# Patient Record
Sex: Female | Born: 1945 | Race: Black or African American | Hispanic: No | Marital: Single | Smoking: Never smoker
Health system: Southern US, Community
[De-identification: ages and names within clinical notes are randomized; demographics above are authoritative.]

## PROBLEM LIST (undated history)

## (undated) DIAGNOSIS — I10 Essential (primary) hypertension: Secondary | ICD-10-CM

## (undated) DIAGNOSIS — I509 Heart failure, unspecified: Secondary | ICD-10-CM

## (undated) DIAGNOSIS — E119 Type 2 diabetes mellitus without complications: Secondary | ICD-10-CM

## (undated) HISTORY — PX: ABDOMINAL HYSTERECTOMY: SHX81

## (undated) HISTORY — PX: CHOLECYSTECTOMY: SHX55

## (undated) HISTORY — PX: ANKLE SURGERY: SHX546

---

## 2015-11-28 ENCOUNTER — Emergency Department (HOSPITAL_COMMUNITY): Payer: Medicare HMO

## 2015-11-28 ENCOUNTER — Encounter (HOSPITAL_COMMUNITY): Payer: Self-pay

## 2015-11-28 ENCOUNTER — Emergency Department (HOSPITAL_COMMUNITY)
Admission: EM | Admit: 2015-11-28 | Discharge: 2015-11-29 | Disposition: A | Discharge status: Home / Self Care | Payer: Medicare HMO | Attending: Emergency Medicine | Admitting: Emergency Medicine

## 2015-11-28 DIAGNOSIS — M7989 Other specified soft tissue disorders: Secondary | ICD-10-CM

## 2015-11-28 DIAGNOSIS — S8012XA Contusion of left lower leg, initial encounter: Secondary | ICD-10-CM | POA: Diagnosis not present

## 2015-11-28 DIAGNOSIS — E119 Type 2 diabetes mellitus without complications: Secondary | ICD-10-CM | POA: Insufficient documentation

## 2015-11-28 DIAGNOSIS — Y998 Other external cause status: Secondary | ICD-10-CM | POA: Diagnosis not present

## 2015-11-28 DIAGNOSIS — Z79899 Other long term (current) drug therapy: Secondary | ICD-10-CM | POA: Insufficient documentation

## 2015-11-28 DIAGNOSIS — I10 Essential (primary) hypertension: Secondary | ICD-10-CM | POA: Diagnosis not present

## 2015-11-28 DIAGNOSIS — M79662 Pain in left lower leg: Secondary | ICD-10-CM

## 2015-11-28 DIAGNOSIS — Z791 Long term (current) use of non-steroidal anti-inflammatories (NSAID): Secondary | ICD-10-CM | POA: Diagnosis not present

## 2015-11-28 DIAGNOSIS — Y9389 Activity, other specified: Secondary | ICD-10-CM | POA: Diagnosis not present

## 2015-11-28 DIAGNOSIS — S8992XA Unspecified injury of left lower leg, initial encounter: Secondary | ICD-10-CM | POA: Diagnosis present

## 2015-11-28 DIAGNOSIS — Y9289 Other specified places as the place of occurrence of the external cause: Secondary | ICD-10-CM | POA: Insufficient documentation

## 2015-11-28 DIAGNOSIS — W07XXXA Fall from chair, initial encounter: Secondary | ICD-10-CM | POA: Insufficient documentation

## 2015-11-28 DIAGNOSIS — Z794 Long term (current) use of insulin: Secondary | ICD-10-CM | POA: Diagnosis not present

## 2015-11-28 HISTORY — DX: Essential (primary) hypertension: I10

## 2015-11-28 HISTORY — DX: Type 2 diabetes mellitus without complications: E11.9

## 2015-11-28 LAB — HEPATIC FUNCTION PANEL
ALBUMIN: 3 g/dL — AB (ref 3.5–5.0)
ALT: 16 U/L (ref 14–54)
AST: 15 U/L (ref 15–41)
Alkaline Phosphatase: 145 U/L — ABNORMAL HIGH (ref 38–126)
BILIRUBIN TOTAL: 0.5 mg/dL (ref 0.3–1.2)
Bilirubin, Direct: <0.1 mg/dL — ABNORMAL LOW (ref 0.1–0.5)
TOTAL PROTEIN: 7.3 g/dL (ref 6.5–8.1)

## 2015-11-28 LAB — CBC
HCT: 30.6 % — ABNORMAL LOW (ref 36.0–46.0)
Hemoglobin: 10.2 g/dL — ABNORMAL LOW (ref 12.0–15.0)
MCH: 26.4 pg (ref 26.0–34.0)
MCHC: 33.3 g/dL (ref 30.0–36.0)
MCV: 79.3 fL (ref 78.0–100.0)
PLATELETS: 241 10*3/uL (ref 150–400)
RBC: 3.86 MIL/uL — ABNORMAL LOW (ref 3.87–5.11)
RDW: 13.5 % (ref 11.5–15.5)
WBC: 6.1 10*3/uL (ref 4.0–10.5)

## 2015-11-28 LAB — BASIC METABOLIC PANEL
Anion gap: 9 (ref 5–15)
BUN: 8 mg/dL (ref 6–20)
CALCIUM: 8.9 mg/dL (ref 8.9–10.3)
CO2: 26 mmol/L (ref 22–32)
CREATININE: 0.87 mg/dL (ref 0.44–1.00)
Chloride: 101 mmol/L (ref 101–111)
Glucose, Bld: 513 mg/dL — ABNORMAL HIGH (ref 65–99)
Potassium: 4.1 mmol/L (ref 3.5–5.1)
SODIUM: 136 mmol/L (ref 135–145)

## 2015-11-28 LAB — BRAIN NATRIURETIC PEPTIDE: B NATRIURETIC PEPTIDE 5: 170.7 pg/mL — AB (ref 0.0–100.0)

## 2015-11-28 LAB — D-DIMER, QUANTITATIVE (NOT AT ARMC): D DIMER QUANT: 0.36 ug{FEU}/mL (ref 0.00–0.50)

## 2015-11-28 LAB — CBG MONITORING, ED: GLUCOSE-CAPILLARY: 124 mg/dL — AB (ref 65–99)

## 2015-11-28 MED ORDER — DOXYCYCLINE HYCLATE 100 MG PO TABS
100.0000 mg | ORAL_TABLET | Freq: Once | ORAL | Status: AC
  Administered 2015-11-28: 100 mg via ORAL
  Filled 2015-11-28: qty 1

## 2015-11-28 MED ORDER — SODIUM CHLORIDE 0.9 % IV BOLUS (SEPSIS)
1000.0000 mL | Freq: Once | INTRAVENOUS | Status: AC
  Administered 2015-11-28: 1000 mL via INTRAVENOUS

## 2015-11-28 MED ORDER — INSULIN GLARGINE 100 UNIT/ML ~~LOC~~ SOLN
50.0000 [IU] | Freq: Once | SUBCUTANEOUS | Status: AC
  Administered 2015-11-28: 50 [IU] via SUBCUTANEOUS
  Filled 2015-11-28: qty 0.5

## 2015-11-28 MED ORDER — ENOXAPARIN SODIUM 100 MG/ML ~~LOC~~ SOLN
95.0000 mg | Freq: Once | SUBCUTANEOUS | Status: AC
  Administered 2015-11-28: 95 mg via SUBCUTANEOUS
  Filled 2015-11-28: qty 1

## 2015-11-28 MED ORDER — OXYCODONE HCL 5 MG PO TABS
2.5000 mg | ORAL_TABLET | Freq: Once | ORAL | Status: AC
  Administered 2015-11-28: 2.5 mg via ORAL
  Filled 2015-11-28: qty 1

## 2015-11-28 MED ORDER — INSULIN NPH (HUMAN) (ISOPHANE) 100 UNIT/ML ~~LOC~~ SUSP
10.0000 [IU] | Freq: Once | SUBCUTANEOUS | Status: AC
  Administered 2015-11-28: 10 [IU] via SUBCUTANEOUS
  Filled 2015-11-28: qty 10

## 2015-11-28 MED ORDER — DOXYCYCLINE HYCLATE 100 MG PO CAPS: 100.0000 mg | ORAL_CAPSULE | Freq: Two times a day (BID) | ORAL | Status: DC

## 2015-11-28 MED ORDER — ONDANSETRON HCL 4 MG/2ML IJ SOLN
4.0000 mg | Freq: Once | INTRAMUSCULAR | Status: AC
  Administered 2015-11-28: 4 mg via INTRAVENOUS
  Filled 2015-11-28: qty 2

## 2015-11-28 NOTE — Discharge Instructions (Signed)
IMPORTANT PATIENT INSTRUCTIONS:   You have been scheduled for an Outpatient Vascular Study at [facility].    If tomorrow is a Saturday or Sunday, please go to the [facility] Emergency Department registration desk at 8 AM tomorrow morning and tell them you are therefore a vascular study.  If tomorrow is a weekday (Monday - Friday), please go to the [facility] Admitting Department at 8 AM and tell them you are there for a vascular study     Edema Edema is an abnormal buildup of fluids in your bodytissues. Edema is somewhatdependent on gravity to pull the fluid to the lowest place in your body. That makes the condition more common in the legs and thighs (lower extremities). Painless swelling of the feet and ankles is common and becomes more likely as you get older. It is also common in looser tissues, like around your eyes.  When the affected area is squeezed, the fluid may move out of that spot and leave a dent for a few moments. This dent is called pitting.  CAUSES  There are many possible causes of edema. Eating too much salt and being on your feet or sitting for a long time can cause edema in your legs and ankles. Hot weather may make edema worse. Common medical causes of edema include:  Heart failure.  Liver disease.  Kidney disease.  Weak blood vessels in your legs.  Cancer.  An injury.  Pregnancy.  Some medications.  Obesity. SYMPTOMS  Edema is usually painless.Your skin may look swollen or shiny.  DIAGNOSIS  Your health care provider may be able to diagnose edema by asking about your medical history and doing a physical exam. You may need to have tests such as X-rays, an electrocardiogram, or blood tests to check for medical conditions that may cause edema.  TREATMENT  Edema treatment depends on the cause. If you have heart, liver, or kidney disease, you need the treatment appropriate for these conditions. General treatment may include:  Elevation of the  affected body part above the level of your heart.  Compression of the affected body part. Pressure from elastic bandages or support stockings squeezes the tissues and forces fluid back into the blood vessels. This keeps fluid from entering the tissues.  Restriction of fluid and salt intake.  Use of a water pill (diuretic). These medications are appropriate only for some types of edema. They pull fluid out of your body and make you urinate more often. This gets rid of fluid and reduces swelling, but diuretics can have side effects. Only use diuretics as directed by your health care provider. HOME CARE INSTRUCTIONS   Keep the affected body part above the level of your heart when you are lying down.   Do not sit still or stand for prolonged periods.   Do not put anything directly under your knees when lying down.  Do not wear constricting clothing or garters on your upper legs.   Exercise your legs to work the fluid back into your blood vessels. This may help the swelling go down.   Wear elastic bandages or support stockings to reduce ankle swelling as directed by your health care provider.   Eat a low-salt diet to reduce fluid if your health care provider recommends it.   Only take medicines as directed by your health care provider. SEEK MEDICAL CARE IF:   Your edema is not responding to treatment.  You have heart, liver, or kidney disease and notice symptoms of  edema.  You have edema in your legs that does not improve after elevating them.   You have sudden and unexplained weight gain. SEEK IMMEDIATE MEDICAL CARE IF:   You develop shortness of breath or chest pain.   You cannot breathe when you lie down.  You develop pain, redness, or warmth in the swollen areas.   You have heart, liver, or kidney disease and suddenly get edema.  You have a fever and your symptoms suddenly get worse. MAKE SURE YOU:   Understand these instructions.  Will watch your  condition.  Will get help right away if you are not doing well or get worse.   This information is not intended to replace advice given to you by your health care provider. Make sure you discuss any questions you have with your health care provider.   Document Released: 08/17/2005 Document Revised: 09/07/2014 Document Reviewed: 06/09/2013 Elsevier Interactive Patient Education [employer]

## 2015-11-28 NOTE — ED Notes (Signed)
Pt to Xray.

## 2015-11-28 NOTE — ED Notes (Signed)
MD at bedside. 

## 2015-11-28 NOTE — ED Provider Notes (Signed)
CSN: [id number]     Arrival date & time 11/28/15  1748 History   First MD Initiated Contact with Patient 11/28/15 1928     Chief Complaint  Patient presents with  . leg swelling      (Consider location/radiation/quality/duration/timing/severity/associated sxs/prior Treatment) HPI Comments: 70yo F w/ h/o IDDM and HTN who p/w left leg swelling and pain. 10 days ago, the patient was at home trying to get up out of a chair and she tripped over a cord, falling forward onto her knees. Since then, she has had pain in her left lower leg and swelling associated with a lot of bruising. She had her daughter report that the swelling and bruising seemed to be improved today versus at the time of the initial injury. However, the patient was seen at an outside hospital today and had negative x-rays but was sent here to rule out DVT. The patient denies any foot numbness. No fevers or recent illness. No chest pain or shortness of breath.  The history is provided by the patient.    Past Medical History  Diagnosis Date  . Diabetes mellitus without complication (HCC)   . Hypertension    History reviewed. No pertinent past surgical history. No family history on file. Social History  Substance Use Topics  . Smoking status: Never Smoker   . Smokeless tobacco: None  . Alcohol Use: None   OB History    No data available     Review of Systems 10 Systems reviewed and are negative for acute change except as noted in the HPI.    Allergies  Codeine  Home Medications   Prior to Admission medications   Medication Sig Start Date End Date Taking? Authorizing Provider  benazepril (LOTENSIN) 20 MG tablet Take 20 mg by mouth daily.   Yes Historical Provider, MD  gabapentin (NEURONTIN) 300 MG capsule Take 300 mg by mouth 3 (three) times daily.   Yes Historical Provider, MD  insulin glargine (LANTUS) 100 UNIT/ML injection Inject 50 Units into the skin 2 (two) times daily.   Yes Historical Provider, MD   insulin NPH Human (HUMULIN N,NOVOLIN N) 100 UNIT/ML injection Inject 10 Units into the skin. Use as needed for high blood sugar readings (bridge for lantus)   Yes Historical Provider, MD  levothyroxine (SYNTHROID, LEVOTHROID) 150 MCG tablet Take 150 mcg by mouth daily before breakfast.   Yes Historical Provider, MD  naproxen (NAPROSYN) 500 MG tablet Take 500 mg by mouth 2 (two) times daily with a meal.   Yes Historical Provider, MD  doxycycline (VIBRAMYCIN) 100 MG capsule Take 1 capsule (100 mg total) by mouth 2 (two) times daily. 11/28/15   Ambrose Finland Little, MD   BP 157/79 mmHg  Pulse 83  Temp(Src) 98.7 F (37.1 C) (Oral)  Resp 18  Wt 219 lb 1.6 oz (99.383 kg)  SpO2 100% Physical Exam  Constitutional: She is oriented to person, place, and time. She appears well-developed and well-nourished. No distress.  HENT:  Head: Normocephalic and atraumatic.  Eyes: Conjunctivae are normal.  Neck: Neck supple.  Cardiovascular: Normal rate, regular rhythm and normal heart sounds.   No murmur heard. Pulmonary/Chest: Effort normal and breath sounds normal.  Abdominal: Soft. Bowel sounds are normal. She exhibits no distension. There is no tenderness.  Musculoskeletal: She exhibits edema.  Uniform edema of left lower leg from knee to ankle w/ TTP along calf and shin; 2+ DP pulse; normal sensation L foot  Neurological: She is alert and oriented to  person, place, and time.  Fluent speech  Skin: Skin is warm and dry.  Scattered ecchymoses on left lower leg from knee to ankle, left leg slightly warm compared to right  Psychiatric: She has a normal mood and affect. Judgment normal.  Nursing note and vitals reviewed.   ED Course  Procedures (including critical care time) Labs Review Labs Reviewed  CBC - Abnormal; Notable for the following:    RBC 3.86 (*)    Hemoglobin 10.2 (*)    HCT 30.6 (*)    All other components within normal limits  BASIC METABOLIC PANEL - Abnormal; Notable for the  following:    Glucose, Bld 513 (*)    All other components within normal limits  HEPATIC FUNCTION PANEL - Abnormal; Notable for the following:    Albumin 3.0 (*)    Alkaline Phosphatase 145 (*)    Bilirubin, Direct <0.1 (*)    All other components within normal limits  BRAIN NATRIURETIC PEPTIDE - Abnormal; Notable for the following:    B Natriuretic Peptide 170.7 (*)    All other components within normal limits  CBG MONITORING, ED - Abnormal; Notable for the following:    Glucose-Capillary 124 (*)    All other components within normal limits  D-DIMER, QUANTITATIVE (NOT AT [facility])    Imaging Review Dg Tibia/fibula Left  11/28/2015  CLINICAL DATA:  Fall 10 days prior. Left lower extremity swelling and pain. EXAM: LEFT TIBIA AND FIBULA - 2 VIEW COMPARISON:  None. FINDINGS: Diffuse soft tissue swelling. Surgical plate with multiple interlocking screws and single non interlocking cerclage wire is seen in the lateral distal left fibula, with no evidence of hardware fracture or loosening. Two screws are seen at the medial malleolus in the left ankle, with no evidence of hardware fracture or loosening. No osseous fracture or suspicious focal osseous lesion. No evidence of malalignment at the ankle or knee on these views. IMPRESSION: Diffuse soft tissue swelling. Left ankle hardware as described, with no hardware complication. No osseous fracture. Electronically Signed   By: Delbert Phenix M.D.   On: 11/28/2015 20:49   Dg Knee Complete 4 Views Left  11/28/2015  CLINICAL DATA:  Fall 10 days prior. Left lower extremity swelling and pain. EXAM: LEFT KNEE - COMPLETE 4+ VIEW COMPARISON:  None. FINDINGS: Diffuse soft tissue swelling. No fracture, joint effusion, dislocation or suspicious focal osseous lesion. 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 IMPRESSION: Diffuse soft tissue swelling. No fracture, joint effusion or malalignment in the left knee. Electronically Signed   By: Delbert Phenix M.D.   On: 11/28/2015 20:48   I have personally reviewed and evaluated these lab results as part of my medical decision-making.   EKG Interpretation None     Medications  sodium chloride 0.9 % bolus 1,000 mL (1,000 mLs Intravenous New Bag/Given 11/28/15 2055)  insulin NPH Human (HUMULIN N,NOVOLIN N) injection 10 Units (10 Units Subcutaneous Given 11/28/15 2055)  insulin glargine (LANTUS) injection 50 Units (50 Units Subcutaneous Given 11/28/15 2224)  oxyCODONE (Oxy IR/ROXICODONE) immediate release tablet 2.5 mg (2.5 mg Oral Given 11/28/15 2242)  ondansetron (ZOFRAN) injection 4 mg (4 mg Intravenous Given 11/28/15 2242)  enoxaparin (LOVENOX) injection 95 mg (95 mg Subcutaneous Given 11/28/15 2320)  doxycycline (VIBRA-TABS) tablet 100 mg (100 mg Oral Given 11/28/15 2319)    MDM   Final diagnoses:  Pain and swelling of left lower leg   PT w/ ongoing L lower leg pain and swelling  10 days after falling onto her legs. On exam, she was awake and alert, in no acute distress. Vital signs notable for hypertension. She was neurovascularly intact distally. Uniform swelling noted from left knee to ankle with scattered ecchymoses, mild warmth. Obtained plain films of knee and leg as well as ultrasound to evaluate for DVT. Lab work was notable for glucose 513 with normal anion gap. Gave the patient her home dose of Lantus and NPH as well as IVF.   Plain films showed soft tissue swelling without any other acute abnormalities. Due to the time of day, I was unable to obtain an ultrasound. I had a long discussion with the patient and her family members regarding risks and benefits of a dose of Lovenox and follow-up in the morning for ultrasound study here. Family voiced understanding and elected to have one dose of Lovenox here. Because of the patient's warmth and edema on exam, the differential does include cellulitis, especially in the setting of poorly controlled diabetes. I provided patient with a course  of doxycycline to cover for cellulitis. The patient's repeat blood glucose after above treatments was 124, after which I had patient eat a sandwich and drink juice. Family voiced understanding of follow-up plan and patient also instructed to see PCP in 3-4 days to ensure improvement of her symptoms. Discussed supportive care for extremity swelling including compression and elevation. Patient voiced understanding and was discharged in satisfactory condition.   Laurence Spatesachel Morgan Little, MD 11/28/15 [phone]

## 2015-11-28 NOTE — ED Notes (Signed)
CBG 124 

## 2015-11-28 NOTE — ED Notes (Signed)
Patient fell 10 days ago and continuing to have pain and swelling to LLE. Seen [facility] and had negative xrays, here to r/o DVT

## 2015-11-28 NOTE — ED Notes (Signed)
Spoke with Meagan in pharmacy X2 for status of the Lantus.

## 2015-11-28 NOTE — Progress Notes (Signed)
ANTICOAGULATION CONSULT NOTE - Initial Consult  Pharmacy Consult for Lovenox  Indication: rule out DVT  Allergies  Allergen Reactions  . Codeine Nausea Only    Vital Signs: Temp: 98.7 F (37.1 C) (03/30 1757) Temp Source: Oral (03/30 1757) BP: 165/81 mmHg (03/30 2103) Pulse Rate: 79 (03/30 2103)  Labs:  Recent Labs  11/28/15 1803  HGB 10.2*  HCT 30.6*  PLT 241  CREATININE 0.87    CrCl cannot be calculated (Unknown ideal weight.).   Medical History: Past Medical History  Diagnosis Date  . Diabetes mellitus without complication (HCC)   . Hypertension     Assessment: [age] yof admitted 11/28/2015  With leg swelling. Being worked up for possible DVT. Pharmacy consulted to dose treatment dose lovenox x 1 until the patient gets an ultrasound on 3/31. Patient not on any anticoagulation PTA. Pt weight 99.5kg.  Hgb 10.2, PLT 241, no bleeding noted  Goal of Therapy:  Monitor platelets by anticoagulation protocol: Yes   Plan:  Lovenox 95mg  x1  F/U ultrasound plans and need for further lovenox dosing F/U plans for longterm AC Monitor for bleeding  Ryane Konieczny C. Marvis MoellerMiles, PharmD Pharmacy Resident Pager: [phone] 11/28/2015 10:27 PM

## 2015-11-29 ENCOUNTER — Ambulatory Visit (HOSPITAL_BASED_OUTPATIENT_CLINIC_OR_DEPARTMENT_OTHER)
Admission: RE | Admit: 2015-11-29 | Discharge: 2015-11-29 | Disposition: A | Discharge status: Home / Self Care | Payer: Medicare HMO | Source: Ambulatory Visit | Attending: Emergency Medicine | Admitting: Emergency Medicine

## 2015-11-29 DIAGNOSIS — M79609 Pain in unspecified limb: Secondary | ICD-10-CM

## 2015-11-29 DIAGNOSIS — M7989 Other specified soft tissue disorders: Secondary | ICD-10-CM | POA: Diagnosis not present

## 2015-11-29 DIAGNOSIS — E119 Type 2 diabetes mellitus without complications: Secondary | ICD-10-CM

## 2015-11-29 DIAGNOSIS — I10 Essential (primary) hypertension: Secondary | ICD-10-CM | POA: Insufficient documentation

## 2015-11-29 DIAGNOSIS — M79662 Pain in left lower leg: Secondary | ICD-10-CM | POA: Insufficient documentation

## 2015-11-29 NOTE — Progress Notes (Signed)
VASCULAR LAB PRELIMINARY  PRELIMINARY  PRELIMINARY  PRELIMINARY  Left lower extremity venous duplex completed.    Preliminary report:  Left:  No evidence of DVT, superficial thrombosis, or Baker's cyst.  Rosi Secrist, RVT 11/29/2015, 9:14 AM

## 2017-12-11 ENCOUNTER — Encounter (HOSPITAL_COMMUNITY): Payer: Self-pay | Admitting: Emergency Medicine

## 2017-12-11 ENCOUNTER — Emergency Department (HOSPITAL_COMMUNITY)
Admission: EM | Admit: 2017-12-11 | Discharge: 2017-12-12 | Disposition: A | Discharge status: Home / Self Care | Payer: Medicare HMO | Attending: Emergency Medicine | Admitting: Emergency Medicine

## 2017-12-11 ENCOUNTER — Other Ambulatory Visit: Payer: Self-pay

## 2017-12-11 DIAGNOSIS — I110 Hypertensive heart disease with heart failure: Secondary | ICD-10-CM | POA: Insufficient documentation

## 2017-12-11 DIAGNOSIS — I509 Heart failure, unspecified: Secondary | ICD-10-CM | POA: Insufficient documentation

## 2017-12-11 DIAGNOSIS — Z794 Long term (current) use of insulin: Secondary | ICD-10-CM | POA: Diagnosis not present

## 2017-12-11 DIAGNOSIS — Z7982 Long term (current) use of aspirin: Secondary | ICD-10-CM | POA: Diagnosis not present

## 2017-12-11 DIAGNOSIS — R110 Nausea: Secondary | ICD-10-CM | POA: Insufficient documentation

## 2017-12-11 DIAGNOSIS — E11649 Type 2 diabetes mellitus with hypoglycemia without coma: Secondary | ICD-10-CM | POA: Insufficient documentation

## 2017-12-11 DIAGNOSIS — E162 Hypoglycemia, unspecified: Secondary | ICD-10-CM

## 2017-12-11 HISTORY — DX: Heart failure, unspecified: I50.9

## 2017-12-11 LAB — CBG MONITORING, ED: Glucose-Capillary: 74 mg/dL (ref 65–99)

## 2017-12-11 MED ORDER — ONDANSETRON 4 MG PO TBDP
4.0000 mg | ORAL_TABLET | Freq: Once | ORAL | Status: AC
  Administered 2017-12-11: 4 mg via ORAL
  Filled 2017-12-11: qty 1

## 2017-12-11 NOTE — ED Triage Notes (Signed)
Pt c/o nausea since trying to eat dinner tonight.

## 2017-12-11 NOTE — ED Notes (Signed)
I gave pt diet gingerale and peanut butter crackers.

## 2017-12-12 LAB — CBG MONITORING, ED: GLUCOSE-CAPILLARY: 135 mg/dL — AB (ref 65–99)

## 2017-12-12 MED ORDER — ONDANSETRON HCL 4 MG PO TABS: 4.0000 mg | ORAL_TABLET | Freq: Three times a day (TID) | ORAL | 0 refills | Status: AC | PRN

## 2017-12-12 NOTE — Discharge Instructions (Addendum)
Write down your blood sugars every day in the morning and at bedtime so you can show your doctor. Since you are eating less, you may need to adjust your insulin doses so you don't get low blood sugars. Talk to your doctor about your loss of appetite. Take the zofran if needed for nausea. Return to the ED if you have uncontrolled vomiting or seem worse.

## 2017-12-12 NOTE — ED Provider Notes (Signed)
[facility] EMERGENCY DEPARTMENT Provider Note   CSN: [id number] Arrival date & time: 12/11/17  2241  Time seen 23:30 PM   History   Chief Complaint Chief Complaint  Patient presents with  . Nausea    HPI Ashley Stone is a 72 y.o. female.  HPI patient has a history of diabetes.  Her daughter reports she has had decreased appetite over the past several months.  Patient states she ate lunch about 1 PM and this evening they went to a restaurant about 8 PM.  They state their food took a while to come to the table and patient felt like her blood sugar was dropping.  She was only able to eat a little bit of food and then she started getting very nauseated.  Daughter states she checked her blood sugar about 30 minutes prior to arrival to the ED and her blood sugar was 97.  Patient denies abdominal pain, diaphoresis, daughter confirms there was no confusion, patient denies chest pain, diarrhea, or loss of consciousness.  Patient was given Zofran and at the time of my exam she states she feels back to normal.  She was however nauseated when she first arrived to the ED.  PCP Majel Homer, MD   Past Medical History:  Diagnosis Date  . CHF (congestive heart failure) (HCC)   . Diabetes mellitus without complication (HCC)   . Hypertension     There are no active problems to display for this patient.   Past Surgical History:  Procedure Laterality Date  . ABDOMINAL HYSTERECTOMY    . ANKLE SURGERY    . CHOLECYSTECTOMY       OB History   None      Home Medications    Prior to Admission medications   Medication Sig Start Date End Date Taking? Authorizing Provider  aspirin EC 81 MG tablet Take 81 mg by mouth daily.   Yes [provider]  carvedilol (COREG) 25 MG tablet Take 25 mg by mouth 2 (two) times daily with a meal.   Yes [provider]  furosemide (LASIX) 40 MG tablet Take 40 mg by mouth daily. 10/04/17  Yes [provider]  insulin aspart  (NOVOLOG FLEXPEN) 100 UNIT/ML FlexPen Inject 10 Units into the skin 3 (three) times daily with meals. As directed per sliding scale based on blood sugar levels 10/04/17  Yes [provider]  insulin degludec (TRESIBA FLEXTOUCH) 100 UNIT/ML SOPN FlexTouch Pen Inject 50-130 Units into the skin daily at 3 pm.  10/04/17  Yes [provider]  levothyroxine (SYNTHROID, LEVOTHROID) 175 MCG tablet Take 175 mcg by mouth daily before breakfast.    Yes [provider]  potassium chloride SA (K-DUR,KLOR-CON) 20 MEQ tablet Take 20 mEq by mouth daily.  10/04/17  Yes [provider]  ondansetron (ZOFRAN) 4 MG tablet Take 1 tablet (4 mg total) by mouth every 8 (eight) hours as needed. 12/12/17   Devoria Albe, MD    Family History No family history on file.  Social History Social History   Tobacco Use  . Smoking status: Never Smoker  . Smokeless tobacco: Never Used  Substance Use Topics  . Alcohol use: Not Currently  . Drug use: Not Currently  lives at home Lives alone   Allergies   Codeine   Review of Systems Review of Systems  All other systems reviewed and are negative.    Physical Exam Updated Vital Signs BP (!) 174/80 (BP Location: Right Arm)   Pulse 77  Temp 98.2 F (36.8 C) (Oral)   Resp 17   Ht 5\' 10"  (1.778 m)   Wt 106.1 kg (234 lb)   SpO2 96%   BMI 33.58 kg/m   Physical Exam  Constitutional: She is oriented to person, place, and time. She appears well-developed and well-nourished.  Non-toxic appearance. She does not appear ill. No distress.  HENT:  Head: Normocephalic and atraumatic.  Right Ear: External ear normal.  Left Ear: External ear normal.  Nose: Nose normal. No mucosal edema or rhinorrhea.  Mouth/Throat: Oropharynx is clear and moist and mucous membranes are normal. No dental abscesses or uvula swelling.  Eyes: Pupils are equal, round, and reactive to light. Conjunctivae and EOM are normal.  Neck: Normal range of motion and full  passive range of motion without pain. Neck supple.  Cardiovascular: Normal rate, regular rhythm and normal heart sounds. Exam reveals no gallop and no friction rub.  No murmur heard. Pulmonary/Chest: Effort normal and breath sounds normal. No respiratory distress. She has no wheezes. She has no rhonchi. She has no rales. She exhibits no tenderness and no crepitus.  Abdominal: Soft. Normal appearance and bowel sounds are normal. She exhibits no distension. There is no tenderness. There is no rebound and no guarding.  Musculoskeletal: Normal range of motion. She exhibits no edema or tenderness.  Moves all extremities well.   Neurological: She is alert and oriented to person, place, and time. She has normal strength. No cranial nerve deficit.  Skin: Skin is warm, dry and intact. No rash noted. No erythema. No pallor.  Psychiatric: She has a normal mood and affect. Her speech is normal and behavior is normal. Her mood appears not anxious.  Nursing note and vitals reviewed.    ED Treatments / Results  Labs (all labs ordered are listed, but only abnormal results are displayed)  Results for orders placed or performed during the hospital encounter of 12/11/17  CBG monitoring, ED  Result Value Ref Range   Glucose-Capillary 74 65 - 99 mg/dL   Comment 1 Notify RN    Comment 2 Document in Chart   CBG monitoring, ED  Result Value Ref Range   Glucose-Capillary 135 (H) 65 - 99 mg/dL   Laboratory interpretation all normal    EKG None  Radiology No results found.  Procedures Procedures (including critical care time)  Medications Ordered in ED Medications  ondansetron (ZOFRAN-ODT) disintegrating tablet 4 mg (4 mg Oral Given 12/11/17 2303)     Initial Impression / Assessment and Plan / ED Course  I have reviewed the triage vital signs and the nursing notes.  Pertinent labs & imaging results that were available during my care of the patient were reviewed by me and considered in my medical  decision making (see chart for details).     I had ordered Zofran after reading patient's complaint.  When I saw the patient she states she felt back to normal and felt like she could eat or drink.  We rechecked her CBG and gave patient something to eat.  Recheck at 1 AM patient only ate one cracker and her cup of ginger ale appears full.  Patient seems to be sleepy.  I am going to repeat her CBG.  I am afraid her blood sugar is dropping lower.  Strongly encouraged patient to eat more, she states the crackers were too salty.  Patient's repeat CBG was in the 130s.  When I rechecked the patient at 1:40 AM she states she is feeling  fine.  She was discharged home.  We discussed keeping a log of her blood sugars to show her doctor in case they need to adjust her insulin doses.  She can also talk to her primary care doctor about her loss of appetite and see what she recommends for treatment.  Final Clinical Impressions(s) / ED Diagnoses   Final diagnoses:  Nausea  Hypoglycemia    ED Discharge Orders        Ordered    ondansetron (ZOFRAN) 4 MG tablet  Every 8 hours PRN     12/12/17 0155     Plan discharge  Devoria Albe, MD, Concha Pyo, MD 12/12/17 0157

## 2017-12-16 LAB — LIPID PANEL
CHOLESTEROL: 141 (ref 0–200)
HDL: 46 (ref 35–70)
LDL Cholesterol: 80
Triglycerides: 73 (ref 40–160)

## 2017-12-16 LAB — HEMOGLOBIN A1C: Hemoglobin A1C: 8.9

## 2017-12-16 LAB — MICROALBUMIN, URINE: Microalb, Ur: 0.95

## 2017-12-16 LAB — TSH: TSH: 0.01 — AB (ref <=5.90)

## 2017-12-16 LAB — BASIC METABOLIC PANEL
BUN: 16 (ref 4–21)
CREATININE: 0.9 (ref <=1.1)

## 2018-01-21 ENCOUNTER — Ambulatory Visit: Payer: Self-pay | Admitting: "Endocrinology

## 2018-02-28 ENCOUNTER — Encounter: Payer: Self-pay | Admitting: "Endocrinology

## 2018-02-28 ENCOUNTER — Ambulatory Visit (INDEPENDENT_AMBULATORY_CARE_PROVIDER_SITE_OTHER): Payer: Medicare HMO | Admitting: "Endocrinology

## 2018-02-28 VITALS — BP 131/82 | HR 88 | Ht 69.5 in | Wt 231.0 lb

## 2018-02-28 DIAGNOSIS — I10 Essential (primary) hypertension: Secondary | ICD-10-CM | POA: Insufficient documentation

## 2018-02-28 DIAGNOSIS — E1165 Type 2 diabetes mellitus with hyperglycemia: Secondary | ICD-10-CM | POA: Insufficient documentation

## 2018-02-28 DIAGNOSIS — E039 Hypothyroidism, unspecified: Secondary | ICD-10-CM

## 2018-02-28 MED ORDER — METFORMIN HCL 500 MG PO TABS: 500.0000 mg | ORAL_TABLET | Freq: Two times a day (BID) | ORAL | 2 refills | Status: AC

## 2018-02-28 NOTE — Progress Notes (Signed)
Endocrinology Consult Note       02/28/2018, 1:05 PM   Subjective:    Patient ID: Ashley Stone, female    DOB: 10/20/1945.  Ashley Stone is being seen in consultation for management of currently uncontrolled symptomatic diabetes requested by  Majel Homer, MD.   Past Medical History:  Diagnosis Date  . CHF (congestive heart failure) (HCC)   . Diabetes mellitus without complication (HCC)   . Hypertension    Past Surgical History:  Procedure Laterality Date  . ABDOMINAL HYSTERECTOMY    . ANKLE SURGERY    . CHOLECYSTECTOMY     Social History   Socioeconomic History  . Marital status: Single    Spouse name: Not on file  . Number of children: Not on file  . Years of education: Not on file  . Highest education level: Not on file  Occupational History  . Not on file  Social Needs  . Financial resource strain: Not on file  . Food insecurity:    Worry: Not on file    Inability: Not on file  . Transportation needs:    Medical: Not on file    Non-medical: Not on file  Tobacco Use  . Smoking status: Never Smoker  . Smokeless tobacco: Never Used  Substance and Sexual Activity  . Alcohol use: Not Currently  . Drug use: Not Currently  . Sexual activity: Not on file  Lifestyle  . Physical activity:    Days per week: Not on file    Minutes per session: Not on file  . Stress: Not on file  Relationships  . Social connections:    Talks on phone: Not on file    Gets together: Not on file    Attends religious service: Not on file    Active member of club or organization: Not on file    Attends meetings of clubs or organizations: Not on file    Relationship status: Not on file  Other Topics Concern  . Not on file  Social History Narrative  . Not on file   Outpatient Encounter Medications as of 02/28/2018  Medication Sig  . aspirin EC 81 MG tablet Take 81 mg by mouth daily.  . carvedilol  (COREG) 25 MG tablet Take 25 mg by mouth 2 (two) times daily with a meal.  . furosemide (LASIX) 40 MG tablet Take 40 mg by mouth daily.  . insulin aspart (NOVOLOG FLEXPEN) 100 UNIT/ML FlexPen Inject 8-14 Units into the skin 3 (three) times daily before meals.  . insulin degludec (TRESIBA FLEXTOUCH) 100 UNIT/ML SOPN FlexTouch Pen Inject 60 Units into the skin at bedtime.  Marland Kitchen levothyroxine (SYNTHROID, LEVOTHROID) 125 MCG tablet Take 125 mcg by mouth daily before breakfast.   . potassium chloride SA (K-DUR,KLOR-CON) 20 MEQ tablet Take 20 mEq by mouth daily.   . metFORMIN (GLUCOPHAGE) 500 MG tablet Take 1 tablet (500 mg total) by mouth 2 (two) times daily with a meal.  . ondansetron (ZOFRAN) 4 MG tablet Take 1 tablet (4 mg total) by mouth every 8 (eight) hours as needed.   No facility-administered encounter medications on file as of 02/28/2018.  ALLERGIES: Allergies  Allergen Reactions  . Codeine Nausea Only    VACCINATION STATUS:  There is no immunization history on file for this patient.  Diabetes  She presents for her initial diabetic visit. She has type 2 diabetes mellitus. Onset time: She was diagnosed at approximate age of 50 years. Her disease course has been fluctuating. Hypoglycemia symptoms include confusion, headaches and nervousness/anxiousness. Pertinent negatives for hypoglycemia include no seizures or tremors. Associated symptoms include blurred vision and polydipsia. Pertinent negatives for diabetes include no chest pain. There are no hypoglycemic complications. Symptoms are worsening. Diabetic complications include retinopathy. Risk factors for coronary artery disease include dyslipidemia, diabetes mellitus, obesity, hypertension, post-menopausal and sedentary lifestyle. Current diabetic treatments: She is currently on Tresiba 130 units every morning and NovoLog 10-14 units 3 times daily AC. Her weight is fluctuating minimally. She is following a generally unhealthy diet. When  asked about meal planning, she reported none. She has not had a previous visit with a dietitian. She never participates in exercise. Her home blood glucose trend is fluctuating dramatically. (Did not bring any meter nor logs to review today.  Her most recent A1c was 8.9% on December 16, 2017.) An ACE inhibitor/angiotensin II receptor blocker is not being taken. Eye exam is current.  Hypertension  This is a chronic problem. The current episode started more than 1 year ago. Associated symptoms include blurred vision and headaches. Pertinent negatives include no chest pain, palpitations or shortness of breath. Risk factors for coronary artery disease include dyslipidemia, diabetes mellitus, post-menopausal state, sedentary lifestyle and obesity. Past treatments include beta blockers. Hypertensive end-organ damage includes retinopathy.    Review of Systems  Constitutional: Negative for chills and fever.  Eyes: Positive for blurred vision.  Respiratory: Negative for cough and shortness of breath.   Cardiovascular: Negative for chest pain and palpitations.       No Shortness of breath  Gastrointestinal: Negative for abdominal pain, diarrhea, nausea and vomiting.  Endocrine: Positive for polydipsia.  Genitourinary: Negative for frequency, hematuria and urgency.  Musculoskeletal: Negative for myalgias.  Skin: Negative for rash.  Neurological: Positive for headaches. Negative for tremors and seizures.  Hematological: Does not bruise/bleed easily.  Psychiatric/Behavioral: Positive for confusion. Negative for hallucinations and suicidal ideas. The patient is nervous/anxious.     Objective:    BP 131/82   Pulse 88   Ht 5' 9.5" (1.765 m)   Wt 231 lb (104.8 kg)   BMI 33.62 kg/m   Wt Readings from Last 3 Encounters:  02/28/18 231 lb (104.8 kg)  12/11/17 234 lb (106.1 kg)  11/28/15 219 lb 1.6 oz (99.4 kg)     Physical Exam  Constitutional: She is oriented to person, place, and time. She appears  well-developed.  HENT:  Head: Normocephalic and atraumatic.  Eyes: EOM are normal.  Neck: Normal range of motion. Neck supple. No tracheal deviation present. No thyromegaly present.  Cardiovascular: Normal rate and regular rhythm.  Pulmonary/Chest: Effort normal and breath sounds normal.  Abdominal: Soft. Bowel sounds are normal. There is no tenderness. There is no guarding.  Musculoskeletal: Normal range of motion. She exhibits no edema.  Neurological: She is alert and oriented to person, place, and time. She has normal reflexes. No cranial nerve deficit. Coordination normal.  Skin: Skin is warm and dry. No rash noted. No erythema. No pallor.  Psychiatric: She has a normal mood and affect. Judgment normal.    CMP ( most recent) CMP     Component Value Date/Time  NA 136 11/28/2015 1803   K 4.1 11/28/2015 1803   CL 101 11/28/2015 1803   CO2 26 11/28/2015 1803   GLUCOSE 513 (H) 11/28/2015 1803   BUN 16 12/16/2017   CREATININE 0.9 12/16/2017   CREATININE 0.87 11/28/2015 1803   CALCIUM 8.9 11/28/2015 1803   PROT 7.3 11/28/2015 1803   ALBUMIN 3.0 (L) 11/28/2015 1803   AST 15 11/28/2015 1803   ALT 16 11/28/2015 1803   ALKPHOS 145 (H) 11/28/2015 1803   BILITOT 0.5 11/28/2015 1803   GFRNONAA >60 11/28/2015 1803   GFRAA >60 11/28/2015 1803   Recent Results (from the past 2160 hour(s))  CBG monitoring, ED     Status: None   Collection Time: 12/11/17 11:39 PM  Result Value Ref Range   Glucose-Capillary 74 65 - 99 mg/dL   Comment 1 Notify RN    Comment 2 Document in Chart   CBG monitoring, ED     Status: Abnormal   Collection Time: 12/12/17  1:20 AM  Result Value Ref Range   Glucose-Capillary 135 (H) 65 - 99 mg/dL  Microalbumin, urine     Status: None   Collection Time: 12/16/17 12:00 AM  Result Value Ref Range   Microalb, Ur 0.95   Basic metabolic panel     Status: None   Collection Time: 12/16/17 12:00 AM  Result Value Ref Range   BUN 16 4 - 21   Creatinine 0.9 0.5 - 1.1   Lipid panel     Status: None   Collection Time: 12/16/17 12:00 AM  Result Value Ref Range   Triglycerides 73 40 - 160   Cholesterol 141 0 - 200   HDL 46 35 - 70   LDL Cholesterol 80   Hemoglobin A1c     Status: None   Collection Time: 12/16/17 12:00 AM  Result Value Ref Range   Hemoglobin A1C 8.9   TSH     Status: Abnormal   Collection Time: 12/16/17 12:00 AM  Result Value Ref Range   TSH 0.01 (A) 0.41 - 5.90    Comment: Free T4 1.91     Assessment & Plan:   1. Uncontrolled type 2 diabetes mellitus with hyperglycemia (HCC)   - Ashley SpinnerLillian Stone has currently uncontrolled symptomatic type 2 DM since 72 years of age,  with most recent A1c of 8.9 %. Recent labs reviewed.  -her diabetes is complicated by retinopathy and Ashley SpinnerLillian Stone remains at a high risk for more acute and chronic complications which include CAD, CVA, CKD, retinopathy, and neuropathy. These are all discussed in detail with the patient.  - I have counseled her on diet management and weight loss, by adopting a carbohydrate restricted/protein rich diet.  - Suggestion is made for her to avoid simple carbohydrates  from her diet including Cakes, Sweet Desserts, Ice Cream, Soda (diet and regular), Sweet Tea, Candies, Chips, Cookies, Store Bought Juices, Alcohol in Excess of  1-2 drinks a day, Artificial Sweeteners, and "Sugar-free" Products. This will help patient to have stable blood glucose profile and potentially avoid unintended weight gain.  - I encouraged her to switch to  unprocessed or minimally processed complex starch and increased protein intake (animal or plant source), fruits, and vegetables.  - she is advised to stick to a routine mealtimes to eat 3 meals  a day and avoid unnecessary snacks ( to snack only to correct hypoglycemia).   - she will be scheduled with Norm SaltPenny Crumpton, RDN, CDE for individualized diabetes education.  -  I have approached her with the following individualized plan to manage  diabetes and patient agrees:   -Given her current glycemic burden with A1c of 8.9% (prior to which 11.7%), she will continue to require intensive treatment with basal/bolus insulin to achieve control of diabetes to target.   -However, at this time she is receiving large dose of insulin causing random hypoglycemic episodes.   -I advised her to lower her basal insulin Tresiba to 60 units nightly, also lower her prandial insulin NovoLog to 8 units 3 times a day with meals  for pre-meal BG readings of 90-150mg /dl, plus patient specific correction dose for unexpected hyperglycemia above 150mg /dl, associated with strict monitoring of glucose 4 times a day-before meals and at bedtime. - Patient is warned not to take insulin without proper monitoring per orders. -Adjustment parameters are given for hypo and hyperglycemia in writing. -Patient is encouraged to call clinic for blood glucose levels less than 70 or above 300 mg /dl. -Her previous labs are reviewed and showing normal renal function.  Patient does not recall any adverse effects or side effects from metformin.  - She will benefit from metformin therapy.  I discussed and initiated metformin 500 mg p.o. twice daily-after breakfast and after supper to advance as tolerated.    - she will be considered for incretin therapy as appropriate next visit. - Patient specific target  A1c;  LDL, HDL, Triglycerides, and  Waist Circumference were discussed in detail.  2) BP/HTN: Her blood pressure is controlled to target.  Is advised to continue her beta-blocker carvedilol 25 mg p.o twice daily.  She will be considered for low-dose lisinopril next visit.  3) Lipids/HPL: Her recent lipid panel showed controlled LDL at 80.  She is not on statin medications.  She will be considered on subsequent visits. 4)  Weight/Diet: CDE Consult will be initiated , exercise, and detailed carbohydrates information provided.  5) hypothyroidism Her thyroid hormone dose was  adjusted after her last lab work on December 16, 2017.  She is advised to continue levothyroxine 125 mcg p.o. daily before breakfast.    - We discussed about correct intake of levothyroxine, at fasting, with water, separated by at least 30 minutes from breakfast, and separated by more than 4 hours from calcium, iron, multivitamins, acid reflux medications (PPIs). -Patient is made aware of the fact that thyroid hormone replacement is needed for life, dose to be adjusted by periodic monitoring of thyroid function tests.  6) Chronic Care/Health Maintenance:  -she  Is not  on ACEI/ARB and Statin medications and  is encouraged to continue to follow up with Ophthalmology, Dentist,  Podiatrist at least yearly or according to recommendations, and advised to  stay away from smoking. I have recommended yearly flu vaccine and pneumonia vaccination at least every 5 years; moderate intensity exercise for up to 150 minutes weekly; and  sleep for at least 7 hours a day.  - I advised patient to maintain close follow up with Majel Homer, MD for primary care needs.  - Time spent with the patient: 45 minutes, of which >50% was spent in obtaining information about her symptoms, reviewing her previous labs, evaluations, and treatments, counseling her about her currently uncontrolled type 2 diabetes, hypothyroidism,, and developing developing  plans for long term treatment based on the latest recommendations.  Ashley Stone participated in the discussions, expressed understanding, and voiced agreement with the above plans.  All questions were answered to her satisfaction. she is encouraged to contact clinic should she  have any questions or concerns prior to her return visit.  Follow up plan: - Return in about 1 month (around 03/28/2018) for follow up with pre-visit labs, meter, and logs.  Marquis Lunch, MD [facility] [address] [place], [place]  [address] Phone: [phone]  Fax: [phone]    02/28/2018, 1:05 PM  This note was partially dictated with voice recognition software. Similar sounding words can be transcribed inadequately or may not  be corrected upon review.

## 2018-02-28 NOTE — Patient Instructions (Signed)

## 2018-03-25 LAB — LIPID PANEL
CHOLESTEROL: 172 (ref 0–200)
HDL: 56 (ref 35–70)
LDL CALC: 97
Triglycerides: 94 (ref 40–160)

## 2018-03-25 LAB — BASIC METABOLIC PANEL
BUN: 10 (ref 4–21)
Creatinine: 1 (ref <=1.1)

## 2018-03-25 LAB — HEMOGLOBIN A1C: HEMOGLOBIN A1C: 9.5

## 2018-03-28 LAB — VITAMIN B12: VITAMIN B 12: 752 pg/mL (ref 200–1100)

## 2018-03-28 LAB — VITAMIN D 25 HYDROXY (VIT D DEFICIENCY, FRACTURES): Vit D, 25-Hydroxy: 7 ng/mL — ABNORMAL LOW (ref 30–100)

## 2018-03-29 ENCOUNTER — Ambulatory Visit (INDEPENDENT_AMBULATORY_CARE_PROVIDER_SITE_OTHER): Payer: Medicare HMO | Admitting: "Endocrinology

## 2018-03-29 ENCOUNTER — Encounter: Payer: Self-pay | Admitting: "Endocrinology

## 2018-03-29 ENCOUNTER — Encounter: Payer: Self-pay | Admitting: Nutrition

## 2018-03-29 ENCOUNTER — Encounter: Payer: Medicare HMO | Attending: Internal Medicine | Admitting: Nutrition

## 2018-03-29 VITALS — Ht 69.0 in | Wt 228.0 lb

## 2018-03-29 VITALS — BP 146/80 | HR 77 | Ht 69.5 in | Wt 228.8 lb

## 2018-03-29 DIAGNOSIS — E1165 Type 2 diabetes mellitus with hyperglycemia: Secondary | ICD-10-CM

## 2018-03-29 DIAGNOSIS — I10 Essential (primary) hypertension: Secondary | ICD-10-CM | POA: Diagnosis not present

## 2018-03-29 DIAGNOSIS — E118 Type 2 diabetes mellitus with unspecified complications: Secondary | ICD-10-CM

## 2018-03-29 DIAGNOSIS — IMO0002 Reserved for concepts with insufficient information to code with codable children: Secondary | ICD-10-CM

## 2018-03-29 DIAGNOSIS — E669 Obesity, unspecified: Secondary | ICD-10-CM

## 2018-03-29 DIAGNOSIS — E039 Hypothyroidism, unspecified: Secondary | ICD-10-CM

## 2018-03-29 DIAGNOSIS — Z713 Dietary counseling and surveillance: Secondary | ICD-10-CM | POA: Insufficient documentation

## 2018-03-29 MED ORDER — ATORVASTATIN CALCIUM 10 MG PO TABS: 10.0000 mg | ORAL_TABLET | Freq: Every day | ORAL | 3 refills | Status: AC

## 2018-03-29 NOTE — Patient Instructions (Signed)

## 2018-03-29 NOTE — Progress Notes (Signed)
  Medical Nutrition Therapy:  Appt start time: 1000 end time:  1030.   Assessment:  Primary concerns today: Type 2 DM. Here with her daughter. Saw Dr. Dorris Fetch today. Will address safety questions next visit. Eats 2-3 meals per day. Not very active. Limited mobility. Memory issues possible. Skips breakfast or lunch at times. Was eating some sweets and snacks at times. Taking Tresiba 60 units a day and Novolog 8+ units with meals. Metformin 500 mg BID. Hasn't met with a diabetes educator before.  Motivated to make changes with her diet and take insulin as prescribed to make improvements in her blood sugars and reduce complications from DM.  Lab Results  Component Value Date   HGBA1C 9.5 03/25/2018   CMP Latest Ref Rng & Units 03/25/2018 12/16/2017 11/28/2015  Glucose 65 - 99 mg/dL - - 513(H)  BUN 4 - '21 10 16 8  '$ Creatinine 0.5 - 1.1 1.0 0.9 0.87  Sodium 135 - 145 mmol/L - - 136  Potassium 3.5 - 5.1 mmol/L - - 4.1  Chloride 101 - 111 mmol/L - - 101  CO2 22 - 32 mmol/L - - 26  Calcium 8.9 - 10.3 mg/dL - - 8.9  Total Protein 6.5 - 8.1 g/dL - - 7.3  Total Bilirubin 0.3 - 1.2 mg/dL - - 0.5  Alkaline Phos 38 - 126 U/L - - 145(H)  AST 15 - 41 U/L - - 15  ALT 14 - 54 U/L - - 16     Preferred Learning Style:   Auditory  Visual  Hands on  No preference indicated   Learning Readiness:   Not ready  Contemplating  Ready  Change in progress   MEDICATIONS: see list   DIETARY INTAKE: Eats 2-3 meals per day. Skips lunch or breaktast at times. Drinks sodas or tea at times and eats some sweets.   Usual physical activity: ADL  Estimated energy needs: 1500  calories 170 g carbohydrates 112 g protein 42 g fat  Progress Towards Goal(s):  In progress.   Nutritional Diagnosis:  NB-1.1 Food and nutrition-related knowledge deficit As related to Diabetes.  As evidenced by  A1C 9.5%.    Intervention:  Nutrition Nutrition and Diabetes education provided on My Plate, CHO counting, meal  planning, portion sizes, timing of meals, avoiding snacks between meals unless having a low blood sugar, target ranges for A1C and blood sugars, signs/symptoms and treatment of hyper/hypoglycemia, monitoring blood sugars, taking medications as prescribed, benefits of exercising 30 minutes per day and prevention of complications of DM. Marland KitchenGoals 1. Follow My Plate 2. Eat 2-3 carb choices per meal 3. Increase fresh fruits and vegetables 4. Drink only water  Test blood sugars 4 times a day Take insulin as prescribed  Teaching Method Utilized:  Visual Auditory Hands on  Handouts given during visit include:  The Plate Method    Meal Plan Card   Barriers to learning/adherence to lifestyle change: none  Demonstrated degree of understanding via:  Teach Back   Monitoring/Evaluation:  Dietary intake, exercise, meal planning, and body weight in 1 month(s).

## 2018-03-29 NOTE — Progress Notes (Signed)
Endocrinology follow up  Note       03/29/2018, 1:26 PM   Subjective:    Patient ID: Ashley Stone, female    DOB: August 20, 1946.  Ashley Stone is being seen in follow-up for management of currently uncontrolled symptomatic diabetes requested by  Majel Homer, MD.   Past Medical History:  Diagnosis Date  . CHF (congestive heart failure) (HCC)   . Diabetes mellitus without complication (HCC)   . Hypertension    Past Surgical History:  Procedure Laterality Date  . ABDOMINAL HYSTERECTOMY    . ANKLE SURGERY    . CHOLECYSTECTOMY     Social History   Socioeconomic History  . Marital status: Single    Spouse name: Not on file  . Number of children: Not on file  . Years of education: Not on file  . Highest education level: Not on file  Occupational History  . Not on file  Social Needs  . Financial resource strain: Not on file  . Food insecurity:    Worry: Not on file    Inability: Not on file  . Transportation needs:    Medical: Not on file    Non-medical: Not on file  Tobacco Use  . Smoking status: Never Smoker  . Smokeless tobacco: Never Used  Substance and Sexual Activity  . Alcohol use: Not Currently  . Drug use: Not Currently  . Sexual activity: Not on file  Lifestyle  . Physical activity:    Days per week: Not on file    Minutes per session: Not on file  . Stress: Not on file  Relationships  . Social connections:    Talks on phone: Not on file    Gets together: Not on file    Attends religious service: Not on file    Active member of club or organization: Not on file    Attends meetings of clubs or organizations: Not on file    Relationship status: Not on file  Other Topics Concern  . Not on file  Social History Narrative  . Not on file   Outpatient Encounter Medications as of 03/29/2018  Medication Sig  . aspirin EC 81 MG tablet Take 81 mg by mouth daily.  .  carvedilol (COREG) 25 MG tablet Take 25 mg by mouth 2 (two) times daily with a meal.  . furosemide (LASIX) 40 MG tablet Take 40 mg by mouth daily.  . insulin aspart (NOVOLOG FLEXPEN) 100 UNIT/ML FlexPen Inject 8-14 Units into the skin 3 (three) times daily before meals.  . insulin degludec (TRESIBA FLEXTOUCH) 100 UNIT/ML SOPN FlexTouch Pen Inject 60 Units into the skin at bedtime.  Marland Kitchen levothyroxine (SYNTHROID, LEVOTHROID) 125 MCG tablet Take 125 mcg by mouth daily before breakfast.   . metFORMIN (GLUCOPHAGE) 500 MG tablet Take 1 tablet (500 mg total) by mouth 2 (two) times daily with a meal.  . ondansetron (ZOFRAN) 4 MG tablet Take 1 tablet (4 mg total) by mouth every 8 (eight) hours as needed.  . potassium chloride SA (K-DUR,KLOR-CON) 20 MEQ tablet Take 20 mEq by mouth daily.    No facility-administered encounter medications on file as  of 03/29/2018.     ALLERGIES: Allergies  Allergen Reactions  . Codeine Nausea Only    VACCINATION STATUS:  There is no immunization history on file for this patient.  Diabetes  She presents for her follow-up diabetic visit. She has type 2 diabetes mellitus. Onset time: She was diagnosed at approximate age of 50 years. Her disease course has been worsening. Hypoglycemia symptoms include confusion, headaches and nervousness/anxiousness. Pertinent negatives for hypoglycemia include no seizures or tremors. Associated symptoms include blurred vision and polydipsia. Pertinent negatives for diabetes include no chest pain. There are no hypoglycemic complications. Symptoms are worsening. Diabetic complications include retinopathy. Risk factors for coronary artery disease include dyslipidemia, diabetes mellitus, obesity, hypertension, post-menopausal and sedentary lifestyle. Current diabetic treatments: She is currently on Tresiba 130 units every morning and NovoLog 10-14 units 3 times daily AC. Her weight is stable. She is following a generally unhealthy diet. When asked  about meal planning, she reported none. She has not had a previous visit with a dietitian. She never participates in exercise. Blood glucose monitoring compliance is inadequate. Her home blood glucose trend is fluctuating dramatically. Her overall blood glucose range is >200 mg/dl. (Presents with random monitoring, inadequate averaging 318 for the last 14 days.  Her A1c is 9.5% increasing from 8.9%.) An ACE inhibitor/angiotensin II receptor blocker is not being taken. Eye exam is current.  Hypertension  This is a chronic problem. The current episode started more than 1 year ago. Associated symptoms include blurred vision and headaches. Pertinent negatives include no chest pain, palpitations or shortness of breath. Risk factors for coronary artery disease include dyslipidemia, diabetes mellitus, post-menopausal state, sedentary lifestyle and obesity. Past treatments include beta blockers. Hypertensive end-organ damage includes retinopathy.    Review of Systems  Constitutional: Negative for chills and fever.  Eyes: Positive for blurred vision.  Respiratory: Negative for cough and shortness of breath.   Cardiovascular: Negative for chest pain and palpitations.       No Shortness of breath  Gastrointestinal: Negative for abdominal pain, diarrhea, nausea and vomiting.  Endocrine: Positive for polydipsia.  Genitourinary: Negative for frequency, hematuria and urgency.  Musculoskeletal: Negative for myalgias.  Skin: Negative for rash.  Neurological: Positive for headaches. Negative for tremors and seizures.  Hematological: Does not bruise/bleed easily.  Psychiatric/Behavioral: Positive for confusion. Negative for hallucinations and suicidal ideas. The patient is nervous/anxious.     Objective:    BP (!) 146/80 (BP Location: Right Arm)   Pulse 77   Wt 228 lb 12.8 oz (103.8 kg)   SpO2 100%   BMI 33.30 kg/m   Wt Readings from Last 3 Encounters:  03/29/18 228 lb (103.4 kg)  03/29/18 228 lb 12.8 oz  (103.8 kg)  02/28/18 231 lb (104.8 kg)     Physical Exam  Constitutional: She is oriented to person, place, and time. She appears well-developed.  HENT:  Head: Normocephalic and atraumatic.  Eyes: EOM are normal.  Neck: Normal range of motion. Neck supple. No tracheal deviation present. No thyromegaly present.  Cardiovascular: Normal rate.  Pulmonary/Chest: Effort normal.  Abdominal: There is no tenderness. There is no guarding.  Musculoskeletal: Normal range of motion. She exhibits no edema.  Neurological: She is alert and oriented to person, place, and time. She has normal reflexes. No cranial nerve deficit. Coordination normal.  Skin: Skin is warm and dry. No rash noted. No erythema. No pallor.  Psychiatric: She has a normal mood and affect. Judgment normal.    CMP ( most  recent) CMP     Component Value Date/Time   NA 136 11/28/2015 1803   K 4.1 11/28/2015 1803   CL 101 11/28/2015 1803   CO2 26 11/28/2015 1803   GLUCOSE 513 (H) 11/28/2015 1803   BUN 10 03/25/2018   CREATININE 1.0 03/25/2018   CREATININE 0.87 11/28/2015 1803   CALCIUM 8.9 11/28/2015 1803   PROT 7.3 11/28/2015 1803   ALBUMIN 3.0 (L) 11/28/2015 1803   AST 15 11/28/2015 1803   ALT 16 11/28/2015 1803   ALKPHOS 145 (H) 11/28/2015 1803   BILITOT 0.5 11/28/2015 1803   GFRNONAA >60 11/28/2015 1803   GFRAA >60 11/28/2015 1803   Recent Results (from the past 2160 hour(s))  Basic metabolic panel     Status: None   Collection Time: 03/25/18 12:00 AM  Result Value Ref Range   BUN 10 4 - 21   Creatinine 1.0 0.5 - 1.1  Lipid panel     Status: None   Collection Time: 03/25/18 12:00 AM  Result Value Ref Range   Triglycerides 94 40 - 160   Cholesterol 172 0 - 200   HDL 56 35 - 70   LDL Cholesterol 97   Hemoglobin A1c     Status: None   Collection Time: 03/25/18 12:00 AM  Result Value Ref Range   Hemoglobin A1C 9.5   VITAMIN D 25 Hydroxy (Vit-D Deficiency, Fractures)     Status: Abnormal   Collection Time:  03/25/18 11:14 AM  Result Value Ref Range   Vit D, 25-Hydroxy 7 (L) 30 - 100 ng/mL    Comment: Vitamin D Status         25-OH Vitamin D: . Deficiency:                    <20 ng/mL Insufficiency:             20 - 29 ng/mL Optimal:                 > or = 30 ng/mL . For 25-OH Vitamin D testing on patients on  D2-supplementation and patients for whom quantitation  of D2 and D3 fractions is required, the QuestAssureD(TM) 25-OH VIT D, (D2,D3), LC/MS/MS is recommended: order  code [address] (patients >50yrs). . For more information on this test, go to: http://education.questdiagnostics.com/faq/FAQ163 (This link is being provided for  informational/educational purposes only.)   Vitamin B12     Status: None   Collection Time: 03/25/18 11:14 AM  Result Value Ref Range   Vitamin B-12 752 200 - 1,100 pg/mL     Assessment & Plan:   1. Uncontrolled type 2 diabetes mellitus with hyperglycemia (HCC)   - Shene Maxfield has currently uncontrolled symptomatic type 2 DM since 72 years of age. -She returns with inadequate monitoring, still fluctuating blood glucose profile with A1c of 9.5% increasing from 8.9%.  He did not bring her insulin administration logs.  - Recent labs reviewed.  -her diabetes is complicated by retinopathy and Ethylene Reznick remains at a high risk for more acute and chronic complications which include CAD, CVA, CKD, retinopathy, and neuropathy. These are all discussed in detail with the patient.  - I have counseled her on diet management and weight loss, by adopting a carbohydrate restricted/protein rich diet.  -  Suggestion is made for her to avoid simple carbohydrates  from her diet including Cakes, Sweet Desserts / Pastries, Ice Cream, Soda (diet and regular), Sweet Tea, Candies, Chips, Cookies, Store Bought Juices, Alcohol  in Excess of  1-2 drinks a day, Artificial Sweeteners, and "Sugar-free" Products. This will help patient to have stable blood glucose profile and  potentially avoid unintended weight gain.  - I encouraged her to switch to  unprocessed or minimally processed complex starch and increased protein intake (animal or plant source), fruits, and vegetables.  - she is advised to stick to a routine mealtimes to eat 3 meals  a day and avoid unnecessary snacks ( to snack only to correct hypoglycemia).   - she will be seen by  Norm SaltPenny Crumpton, RDN, CDE  today for individualized diabetes education.  - I have approached her with the following individualized plan to manage diabetes and patient agrees:   -Given her current glycemic burden with A1c of 9.5% (prior to which 11.7%), she will continue to require intensive treatment with basal/bolus insulin to achieve control of diabetes to target.   -However, she has not engaged properly for safe use of insulin to make optimal adjustment.   -Patient with possible partial cognitive deficit, will need a lot of help to achieve this.  Her daughter is offering to help at  this time.  -I advised her to resume and continue [place]iba 60 units nightly, resume and continue NovoLog  8 units 3 times a day with meals  for pre-meal BG readings of 90-150mg /dl, plus patient specific correction dose for unexpected hyperglycemia above 150mg /dl, associated with strict monitoring of glucose 4 times a day-before meals and at bedtime. - Patient is warned not to take insulin without proper monitoring per orders. -Adjustment parameters are given for hypo and hyperglycemia in writing. -Patient is encouraged to call clinic for blood glucose levels less than 70 or above 300 mg /dl. -Her previsit labs are reviewed and showing normal renal function.   - She will continue to benefit from metformin therapy.   I advised her to continue metformin 500 mg p.o. twice daily  -after breakfast and after supper to advance as tolerated.    - she will be considered for incretin therapy as appropriate next visit. - Patient specific target  A1c;  LDL, HDL,  Triglycerides, and  Waist Circumference were discussed in detail.  2) BP/HTN: Her blood pressure is not  controlled to target.  Is advised to continue her beta-blocker carvedilol 25 mg p.o twice daily.  She will be considered for low-dose lisinopril next visit.  3) Lipids/HPL: Her recent lipid panel showed uncontrolled LDL at 97.  Controlled LDL at 80.  She would benefit from a low-dose statin.   4)  Weight/Diet: CDE Consult has been initiated , exercise, and detailed carbohydrates information provided.  5) hypothyroidism Her thyroid hormone dose was adjusted after her last lab work on December 16, 2017.  She is advised to continue levothyroxine 125 mcg p.o. daily before breakfast.    - We discussed about correct intake of levothyroxine, at fasting, with water, separated by at least 30 minutes from breakfast, and separated by more than 4 hours from calcium, iron, multivitamins, acid reflux medications (PPIs). -Patient is made aware of the fact that thyroid hormone replacement is needed for life, dose to be adjusted by periodic monitoring of thyroid function tests.  6) Chronic Care/Health Maintenance:  -she  Is not  on ACEI/ARB and Statin medications and  is encouraged to continue to follow up with Ophthalmology, Dentist,  Podiatrist at least yearly or according to recommendations, and advised to  stay away from smoking. I have recommended yearly flu vaccine and pneumonia vaccination at  least every 5 years; moderate intensity exercise for up to 150 minutes weekly; and  sleep for at least 7 hours a day.  - I advised patient to maintain close follow up with Majel Homer, MD for primary care needs.  - Time spent with the patient: 25 min, of which >50% was spent in reviewing her blood glucose logs , discussing her hypo- and hyper-glycemic episodes, reviewing her current and  previous labs and insulin doses and developing a plan to avoid hypo- and hyper-glycemia. Please refer to Patient Instructions  for Blood Glucose Monitoring and Insulin/Medications Dosing Guide"  in media tab for additional information. Ainsley Spinner participated in the discussions, expressed understanding, and voiced agreement with the above plans.  All questions were answered to her satisfaction. she is encouraged to contact clinic should she have any questions or concerns prior to her return visit.   Follow up plan: - Return in about 10 days (around 04/08/2018) for follow up with meter and logs- no labs.  Marquis Lunch, MD [facility] [address] [place], [place] [address] Phone: [phone]  Fax: [phone]    03/29/2018, 1:26 PM  This note was partially dictated with voice recognition software. Similar sounding words can be transcribed inadequately or may not  be corrected upon review.

## 2018-03-29 NOTE — Patient Instructions (Signed)
Goals 1. Follow My Plate 2. Eat 2-3 carb choices per meal 3. Increase fresh fruits and vegetables 4. Drink only water  Test blood sugars 4 times a day Take insulin as prescribed

## 2018-04-12 ENCOUNTER — Ambulatory Visit: Payer: Medicare HMO | Admitting: "Endocrinology

## 2019-02-22 ENCOUNTER — Other Ambulatory Visit: Payer: Self-pay

## 2019-02-22 ENCOUNTER — Encounter (HOSPITAL_COMMUNITY): Payer: Self-pay

## 2019-02-22 ENCOUNTER — Emergency Department (HOSPITAL_COMMUNITY)
Admission: EM | Admit: 2019-02-22 | Discharge: 2019-02-22 | Disposition: A | Discharge status: Home / Self Care | Payer: Medicare HMO | Attending: Emergency Medicine | Admitting: Emergency Medicine

## 2019-02-22 ENCOUNTER — Emergency Department (HOSPITAL_COMMUNITY): Payer: Medicare HMO

## 2019-02-22 DIAGNOSIS — Z7982 Long term (current) use of aspirin: Secondary | ICD-10-CM | POA: Diagnosis not present

## 2019-02-22 DIAGNOSIS — R0602 Shortness of breath: Secondary | ICD-10-CM | POA: Diagnosis present

## 2019-02-22 DIAGNOSIS — I110 Hypertensive heart disease with heart failure: Secondary | ICD-10-CM | POA: Diagnosis not present

## 2019-02-22 DIAGNOSIS — Z20828 Contact with and (suspected) exposure to other viral communicable diseases: Secondary | ICD-10-CM | POA: Diagnosis not present

## 2019-02-22 DIAGNOSIS — Z794 Long term (current) use of insulin: Secondary | ICD-10-CM | POA: Insufficient documentation

## 2019-02-22 DIAGNOSIS — E119 Type 2 diabetes mellitus without complications: Secondary | ICD-10-CM | POA: Diagnosis not present

## 2019-02-22 DIAGNOSIS — I509 Heart failure, unspecified: Secondary | ICD-10-CM | POA: Insufficient documentation

## 2019-02-22 DIAGNOSIS — E039 Hypothyroidism, unspecified: Secondary | ICD-10-CM | POA: Diagnosis not present

## 2019-02-22 LAB — SARS CORONAVIRUS 2 BY RT PCR (HOSPITAL ORDER, PERFORMED IN ~~LOC~~ HOSPITAL LAB): SARS Coronavirus 2: NEGATIVE

## 2019-02-22 LAB — D-DIMER, QUANTITATIVE (NOT AT ARMC): D-Dimer, Quant: 0.44 ug/mL-FEU (ref 0.00–0.50)

## 2019-02-22 LAB — CBC WITH DIFFERENTIAL/PLATELET
Abs Immature Granulocytes: 0.02 10*3/uL (ref 0.00–0.07)
Basophils Absolute: 0 10*3/uL (ref 0.0–0.1)
Basophils Relative: 1 %
Eosinophils Absolute: 0.1 10*3/uL (ref 0.0–0.5)
Eosinophils Relative: 1 %
HCT: 32 % — ABNORMAL LOW (ref 36.0–46.0)
Hemoglobin: 10.2 g/dL — ABNORMAL LOW (ref 12.0–15.0)
Immature Granulocytes: 0 %
Lymphocytes Relative: 16 %
Lymphs Abs: 1 10*3/uL (ref 0.7–4.0)
MCH: 26 pg (ref 26.0–34.0)
MCHC: 31.9 g/dL (ref 30.0–36.0)
MCV: 81.4 fL (ref 80.0–100.0)
Monocytes Absolute: 0.5 10*3/uL (ref 0.1–1.0)
Monocytes Relative: 9 %
Neutro Abs: 4.5 10*3/uL (ref 1.7–7.7)
Neutrophils Relative %: 73 %
Platelets: 255 10*3/uL (ref 150–400)
RBC: 3.93 MIL/uL (ref 3.87–5.11)
RDW: 14.4 % (ref 11.5–15.5)
WBC: 6.1 10*3/uL (ref 4.0–10.5)
nRBC: 0 % (ref 0.0–0.2)

## 2019-02-22 LAB — TROPONIN I (HIGH SENSITIVITY)
Troponin I (High Sensitivity): 8 ng/L (ref <18)
Troponin I (High Sensitivity): 8 ng/L (ref <18)

## 2019-02-22 LAB — BASIC METABOLIC PANEL
Anion gap: 8 (ref 5–15)
BUN: 11 mg/dL (ref 8–23)
CO2: 30 mmol/L (ref 22–32)
Calcium: 9 mg/dL (ref 8.9–10.3)
Chloride: 100 mmol/L (ref 98–111)
Creatinine, Ser: 0.76 mg/dL (ref 0.44–1.00)
GFR calc Af Amer: >60 mL/min (ref >60)
GFR calc non Af Amer: >60 mL/min (ref >60)
Glucose, Bld: 97 mg/dL (ref 70–99)
Potassium: 3.9 mmol/L (ref 3.5–5.1)
Sodium: 138 mmol/L (ref 135–145)

## 2019-02-22 MED ORDER — ALBUTEROL SULFATE HFA 108 (90 BASE) MCG/ACT IN AERS
2.0000 | INHALATION_SPRAY | RESPIRATORY_TRACT | Status: DC
  Administered 2019-02-22: 2 via RESPIRATORY_TRACT
  Filled 2019-02-22: qty 6.7

## 2019-02-22 NOTE — ED Provider Notes (Addendum)
[facility] EMERGENCY DEPARTMENT Provider Note   CSN: [id number] Arrival date & time: 02/22/19  1237    History   Chief Complaint Chief Complaint  Patient presents with  . Shortness of Breath    HPI Ashley Stone is a 73 y.o. female.     Shortness of breath since 2 AM today.  No history of COPD or CHF.  She does report URI like symptoms for the past couple days.  He is concerned about COVID, but no exposures.  No substernal chest pain, peripheral edema, fever, sweats, chills, rusty sputum.  Severity of symptoms is mild.  Nothing makes symptoms better or worse     Past Medical History:  Diagnosis Date  . CHF (congestive heart failure) ([place])   . Diabetes mellitus without complication ([place])   . Hypertension     Patient Active Problem List   Diagnosis Date Noted  . Uncontrolled type 2 diabetes mellitus with hyperglycemia ([place]) 02/28/2018  . Hypothyroidism 02/28/2018  . Essential hypertension, benign 02/28/2018    Past Surgical History:  Procedure Laterality Date  . ABDOMINAL HYSTERECTOMY    . ANKLE SURGERY    . CHOLECYSTECTOMY       OB History   No obstetric history on file.      Home Medications    Prior to Admission medications   Medication Sig Start Date End Date Taking? Authorizing Provider  ammonium lactate (LAC-HYDRIN) 12 % lotion Apply 1 application topically as needed for dry skin.   Yes [provider]  aspirin EC 81 MG tablet Take 81 mg by mouth daily.   Yes [provider]  carvedilol (COREG) 25 MG tablet Take 25 mg by mouth 2 (two) times daily with a meal.   Yes [provider]  insulin aspart (NOVOLOG FLEXPEN) 100 UNIT/ML FlexPen Inject 8-14 Units into the skin 3 (three) times daily before meals. 10/04/17  Yes [provider]  levothyroxine (SYNTHROID, LEVOTHROID) 125 MCG tablet Take 125 mcg by mouth daily before breakfast.    Yes [provider]  atorvastatin (LIPITOR) 10 MG tablet Take 1 tablet (10 mg  total) by mouth daily. 03/29/18   Cassandria Anger, MD  furosemide (LASIX) 40 MG tablet Take 40 mg by mouth daily. 10/04/17   [provider]  insulin degludec (TRESIBA FLEXTOUCH) 100 UNIT/ML SOPN FlexTouch Pen Inject 60 Units into the skin at bedtime. 10/04/17   [provider]  metFORMIN (GLUCOPHAGE) 500 MG tablet Take 1 tablet (500 mg total) by mouth 2 (two) times daily with a meal. 02/28/18   Nida, Marella Chimes, MD  ondansetron (ZOFRAN) 4 MG tablet Take 1 tablet (4 mg total) by mouth every 8 (eight) hours as needed. 12/12/17   Rolland Porter, MD  potassium chloride SA (K-DUR,KLOR-CON) 20 MEQ tablet Take 20 mEq by mouth daily.  10/04/17   [provider]    Family History No family history on file.  Social History Social History   Tobacco Use  . Smoking status: Never Smoker  . Smokeless tobacco: Never Used  Substance Use Topics  . Alcohol use: Not Currently  . Drug use: Not Currently     Allergies   Codeine   Review of Systems Review of Systems  All other systems reviewed and are negative.    Physical Exam Updated Vital Signs BP (!) 188/93   Pulse 85   Temp 98 F (36.7 C)   Resp 20   Ht 5\' 10"  (1.778 m)   Wt 97.1 kg  SpO2 97%   BMI 30.71 kg/m   Physical Exam Vitals signs and nursing note reviewed.  Constitutional:      Appearance: She is well-developed.     Comments: nad  HENT:     Head: Normocephalic and atraumatic.  Eyes:     Conjunctiva/sclera: Conjunctivae normal.  Neck:     Musculoskeletal: Neck supple.  Cardiovascular:     Rate and Rhythm: Normal rate and regular rhythm.  Pulmonary:     Effort: Pulmonary effort is normal.     Breath sounds: Normal breath sounds.  Abdominal:     General: Bowel sounds are normal.     Palpations: Abdomen is soft.  Musculoskeletal: Normal range of motion.  Skin:    General: Skin is warm and dry.  Neurological:     Mental Status: She is alert and oriented to person, place, and time.   Psychiatric:        Behavior: Behavior normal.      ED Treatments / Results  Labs (all labs ordered are listed, but only abnormal results are displayed) Labs Reviewed  CBC WITH DIFFERENTIAL/PLATELET - Abnormal; Notable for the following components:      Result Value   Hemoglobin 10.2 (*)    HCT 32.0 (*)    All other components within normal limits  SARS CORONAVIRUS 2 (HOSPITAL ORDER, PERFORMED IN [facility])  BASIC METABOLIC PANEL  TROPONIN I (HIGH SENSITIVITY)  TROPONIN I (HIGH SENSITIVITY)  D-DIMER, QUANTITATIVE (NOT [facility])    EKG EKG Interpretation  Date/Time:  Wednesday February 22 2019 12:50:27 EDT Ventricular Rate:  83 PR Interval:  164 QRS Duration: 92 QT Interval:  372 QTC Calculation: 437 R Axis:   -7 Text Interpretation:  Normal sinus rhythm Cannot rule out Anterior infarct , age undetermined Abnormal ECG Confirmed by Donnetta Hutchingook, Geza Beranek ([id number]) on 02/22/2019 4:08:53 PM   Radiology Dg Chest Port 1 View  Result Date: 02/22/2019 CLINICAL DATA:  Shortness of breath since this morning. History of diabetes and congestive heart failure. EXAM: PORTABLE CHEST 1 VIEW COMPARISON:  None. FINDINGS: 1750 hours. Mild cardiomegaly and aortic atherosclerosis. There is vascular congestion without overt pulmonary edema. There may be small bilateral pleural effusions. No pneumothorax or confluent airspace opacity. The bones appear unremarkable. Telemetry leads overlie the chest. IMPRESSION: Cardiomegaly with vascular congestion and possible small pleural effusions. No overt pulmonary edema. Electronically Signed   By: Carey BullocksWilliam  Veazey M.D.   On: 02/22/2019 18:12    Procedures Procedures (including critical care time)  Medications Ordered in ED Medications  albuterol (VENTOLIN HFA) 108 (90 Base) MCG/ACT inhaler 2 puff (has no administration in time range)     Initial Impression / Assessment and Plan / ED Course  I have reviewed the triage vital signs and the nursing notes.   Pertinent labs & imaging results that were available during my care of the patient were reviewed by me and considered in my medical decision making (see chart for details).        Patient reports URI symptoms and dyspnea.  She appears nontoxic.  Will obtain basic labs, chest x-ray, COVID.  Patient rechecked at 2030: No respiratory distress.  COVID negative.  Chest x-ray showed no obvious pneumonia.  Will Rx albuterol inhaler.  Final Clinical Impressions(s) / ED Diagnoses   Final diagnoses:  SOB (shortness of breath)    ED Discharge Orders    None       Donnetta Hutchingook, Loran Auguste, MD 02/22/19 1607    Donnetta Hutchingook, Nolah Krenzer,  MD 02/22/19 2033

## 2019-02-22 NOTE — ED Notes (Addendum)
resp called for inhaled + education

## 2019-02-22 NOTE — ED Notes (Signed)
Pt has been having SOB since 2am. NAD. No history of COPD or CHF. Pt ambulatory to triage with a cane.

## 2019-02-22 NOTE — Discharge Instructions (Addendum)
Test for Covid was negative.  No obvious pneumonia on your chest x-ray.  Use your inhaler every 3-4 hours for coughing and shortness of breath.  Follow-up with your primary care doctor

## 2019-11-11 IMAGING — CR PORTABLE CHEST - 1 VIEW
1 series · 2 of 2 positions shown · non-contrast
Comparison: None.

CLINICAL DATA: Shortness of breath since this morning. History of
diabetes and congestive heart failure.

EXAM:
PORTABLE CHEST 1 VIEW

[Series 1: portable · 0.17mm/px · 2 of 2 slices shown]
[im 1/2]
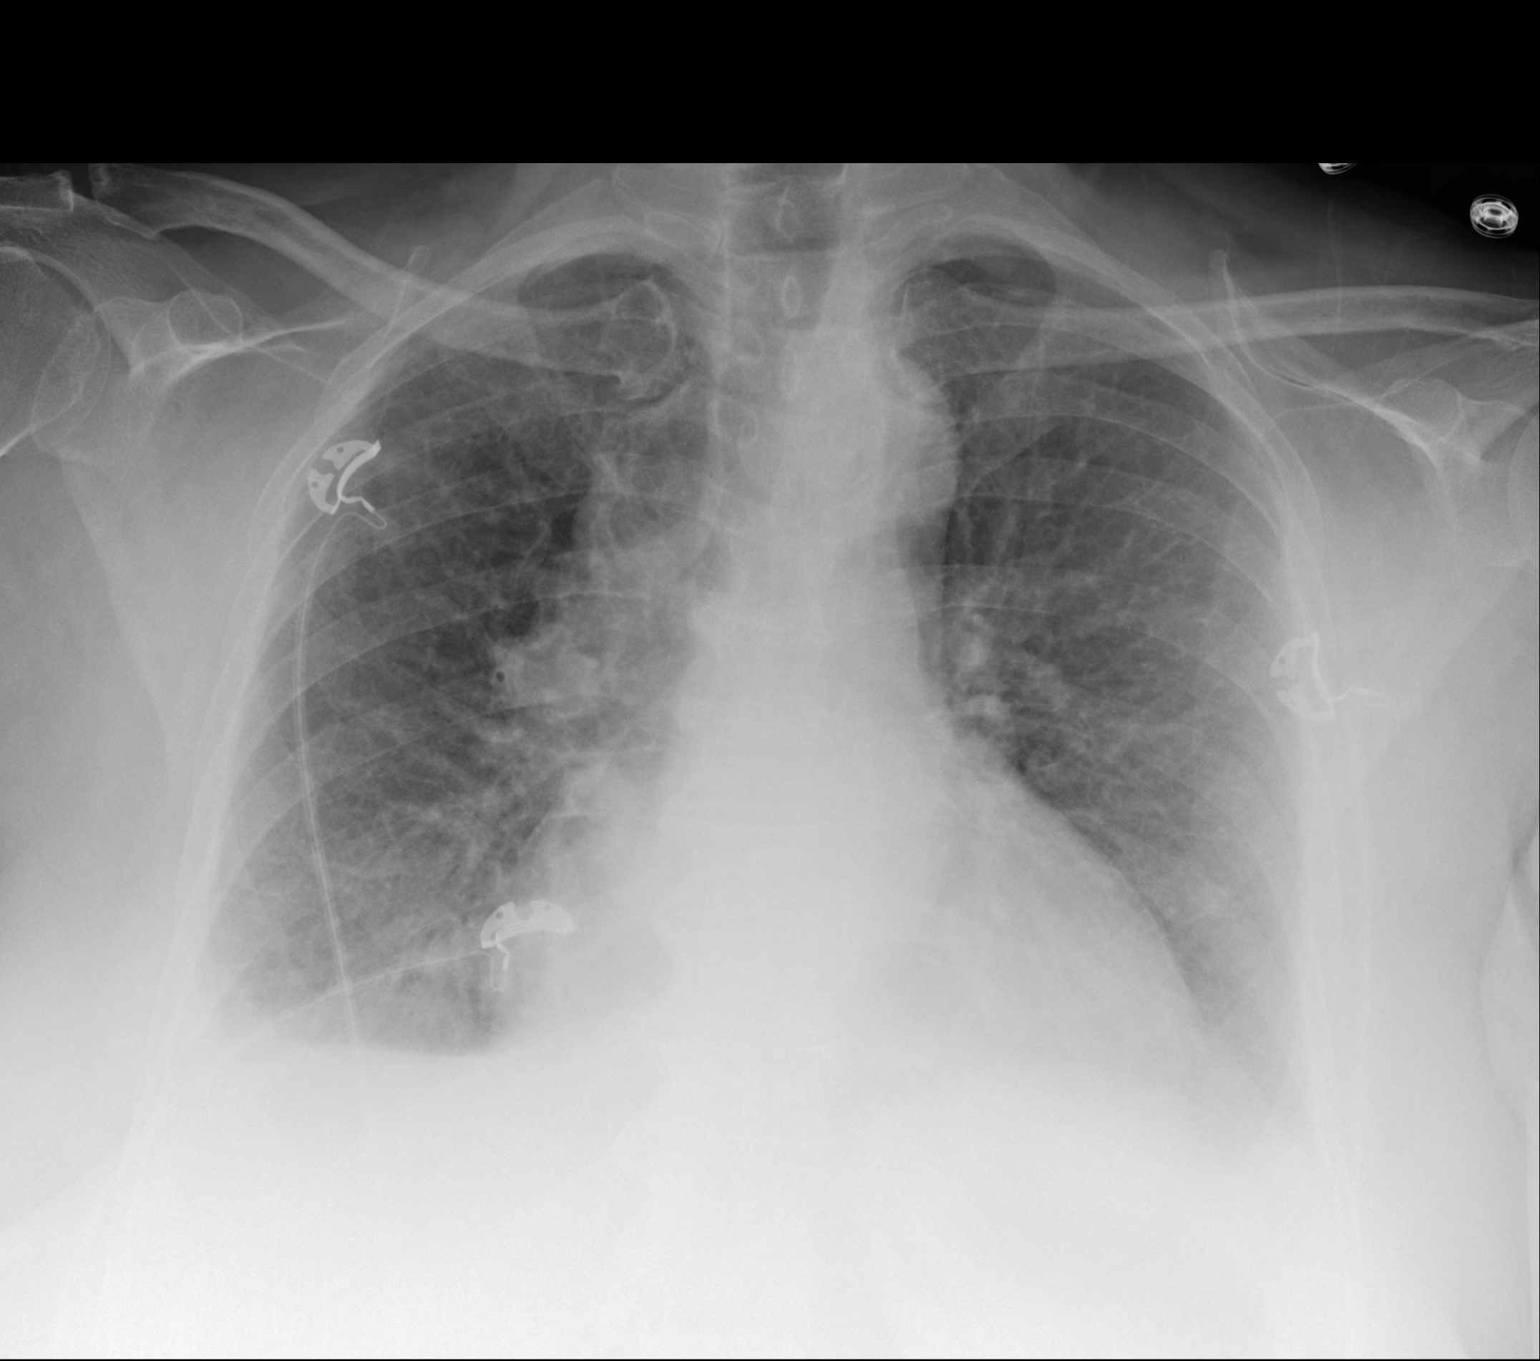
[im 2/2]
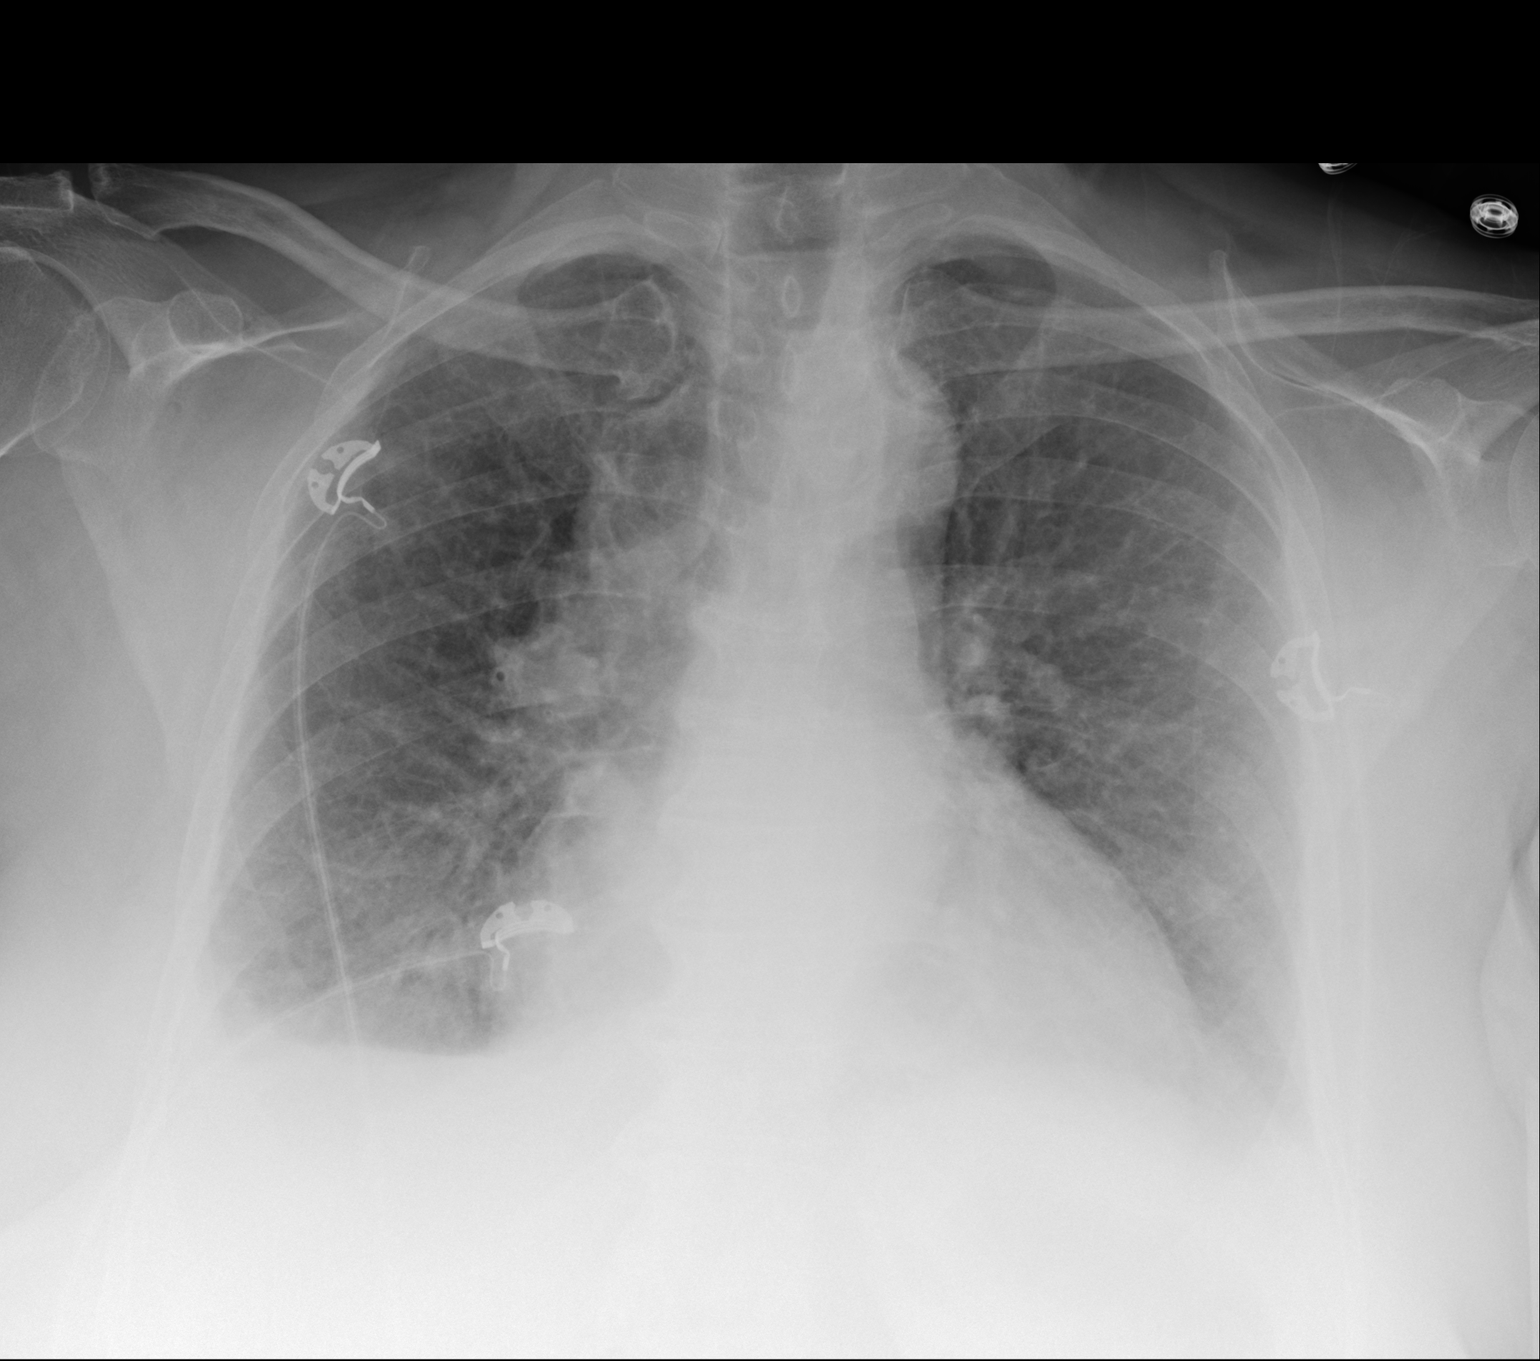

[2 of 2 positions shown; findings below may reference images not displayed]

FINDINGS: 6660 hours. Mild cardiomegaly and aortic atherosclerosis. There is
vascular congestion without overt pulmonary edema. There may be
small bilateral pleural effusions. No pneumothorax or confluent
airspace opacity. The bones appear unremarkable. Telemetry leads
overlie the chest.
IMPRESSION: Cardiomegaly with vascular congestion and possible small pleural
effusions. No overt pulmonary edema.

## 2022-04-10 ENCOUNTER — Encounter (HOSPITAL_BASED_OUTPATIENT_CLINIC_OR_DEPARTMENT_OTHER): Payer: Medicaid Other | Attending: General Surgery | Admitting: General Surgery

## 2022-04-10 DIAGNOSIS — E1151 Type 2 diabetes mellitus with diabetic peripheral angiopathy without gangrene: Secondary | ICD-10-CM | POA: Insufficient documentation

## 2022-04-10 DIAGNOSIS — E1165 Type 2 diabetes mellitus with hyperglycemia: Secondary | ICD-10-CM | POA: Diagnosis not present

## 2022-04-10 DIAGNOSIS — L97822 Non-pressure chronic ulcer of other part of left lower leg with fat layer exposed: Secondary | ICD-10-CM | POA: Diagnosis not present

## 2022-04-10 DIAGNOSIS — E11622 Type 2 diabetes mellitus with other skin ulcer: Secondary | ICD-10-CM | POA: Diagnosis present

## 2022-04-10 NOTE — Progress Notes (Signed)
MAKHIYA, COBURN ([id number]) Visit Report for 04/10/2022 Allergy List Details Patient Name: Date of Service: Ashley Stone, Ashley Stone 04/10/2022 8:00 A M Medical Record Number: [id number] Patient Account Number: [id number] Date of Birth/Sex: Treating RN: Apr 01, 1946 (76 y.o. [name] Primary Care Tatayana Beshears: Majel Homer Other Clinician: Referring Kash Mothershead: Treating Suleyma Wafer/Extender: Jonah Blue in Treatment: 0 Allergies Active Allergies No Known Allergies Allergy Notes Electronic Signature(s) Signed: 04/10/2022 4:44:19 PM By: Karie Schwalbe RN Entered By: Karie Schwalbe on 04/10/2022 08:15:32 -------------------------------------------------------------------------------- Arrival Information Details Patient Name: Date of Service: Ashley Stone, Ashley Stone 04/10/2022 8:00 A M Medical Record Number: [id number] Patient Account Number: [id number] Date of Birth/Sex: Treating RN: 09-17-1945 (76 y.o. [name] Primary Care Sophiah Rolin: Majel Homer Other Clinician: Referring Dontae Minerva: Treating Cali Hope/Extender: Jonah Blue in Treatment: 0 Visit Information Patient Arrived: Danella Maiers Time: 08:12 Accompanied By: daughter Transfer Assistance: None Patient Identification Verified: Yes Electronic Signature(s) Signed: 04/10/2022 4:44:19 PM By: Karie Schwalbe RN Entered By: Karie Schwalbe on 04/10/2022 08:13:52 -------------------------------------------------------------------------------- Clinic Level of Care Assessment Details Patient Name: Date of Service: Ashley Stone, Ashley Stone 04/10/2022 8:00 A M Medical Record Number: [id number] Patient Account Number: [id number] Date of Birth/Sex: Treating RN: [birth year] (76 y.o. [name] Primary Care Verenise Moulin: Majel Homer Other Clinician: Referring Herman Fiero: Treating Dhriti Fales/Extender: Jonah Blue in Treatment: 0 Clinic Level of Care Assessment  Items TOOL 1 Quantity Score X- 1 0 Use when EandM and Procedure is performed on INITIAL visit ASSESSMENTS - Nursing Assessment / Reassessment X- 1 20 General Physical Exam (combine w/ comprehensive assessment (listed just below) when performed on new pt. evals) X- 1 25 Comprehensive Assessment (HX, ROS, Risk Assessments, Wounds Hx, etc.) ASSESSMENTS - Wound and Skin Assessment / Reassessment []  - 0 Dermatologic / Skin Assessment (not related to wound area) ASSESSMENTS - Ostomy and/or Continence Assessment and Care []  - 0 Incontinence Assessment and Management []  - 0 Ostomy Care Assessment and Management (repouching, etc.) PROCESS - Coordination of Care X - Simple Patient / Family Education for ongoing care 1 15 []  - 0 Complex (extensive) Patient / Family Education for ongoing care X- 1 10 Staff obtains , Records, T Results / Process Orders est X- 1 10 Staff telephones HHA, Nursing Homes / Clarify orders / etc []  - 0 Routine Transfer to another Facility (non-emergent condition) []  - 0 Routine Hospital Admission (non-emergent condition) X- 1 15 New Admissions / / Ordering NPWT Apligraf, etc. , []  - 0 Emergency Hospital Admission (emergent condition) PROCESS - Special Needs []  - 0 Pediatric / Minor Patient Management []  - 0 Isolation Patient Management []  - 0 Hearing / Language / Visual special needs []  - 0 Assessment of Community assistance (transportation, D/C planning, etc.) []  - 0 Additional assistance / Altered mentation []  - 0 Support Surface(s) Assessment (bed, cushion, seat, etc.) INTERVENTIONS - Miscellaneous []  - 0 External ear exam []  - 0 Patient Transfer (multiple staff / / Similar devices) []  - 0 Simple Staple / Suture removal (25 or less) []  - 0 Complex Staple / Suture removal (26 or more) []  - 0 Hypo/Hyperglycemic Management (do not check if billed separately) X- 1 15 Ankle / Brachial Index (ABI) - do  not check if billed separately Has the patient been seen at the hospital within the last three years: Yes Total Score: 110 Level Of Care: New/Established - Level 3 Electronic Signature(s) Signed: 04/10/2022 4:44:19 PM By: Chiropractor RN Entered By: on 04/10/2022  16:39:33 -------------------------------------------------------------------------------- Compression Therapy Details Patient Name: Date of Service: SOMALIA, SEGLER 04/10/2022 8:00 A M Medical Record Number: [id number] Patient Account Number: [id number] Date of Birth/Sex: Treating RN: 04/20/1946 (76 y.o. [name] Primary Care Siris Hoos: Majel Homer Other Clinician: Referring Tacy Chavis: Treating Achsah Mcquade/Extender: Jonah Blue in Treatment: 0 Compression Therapy Performed for Wound Assessment: Wound #1 Left,Posterior Lower Leg Performed By: Clinician Karie Schwalbe, RN Compression Type: Three Layer Post Procedure Diagnosis Same as Pre-procedure Electronic Signature(s) Signed: 04/10/2022 4:44:19 PM By: Karie Schwalbe RN Entered By: Karie Schwalbe on 04/10/2022 09:41:54 -------------------------------------------------------------------------------- Encounter Discharge Information Details Patient Name: Date of Service: Ashley Stone, Ashley Stone 04/10/2022 8:00 A M Medical Record Number: [id number] Patient Account Number: [id number] Date of Birth/Sex: Treating RN: 1946-05-02 (76 y.o. [name] Primary Care Kaleel Schmieder: Majel Homer Other Clinician: Referring Devaunte Gasparini: Treating Carliss Porcaro/Extender: Jonah Blue in Treatment: 0 Encounter Discharge Information Items Post Procedure Vitals Discharge Condition: Stable Temperature (F): 98 Ambulatory Status: Cane Pulse (bpm): 89 Discharge Destination: Home Respiratory Rate (breaths/min): 18 Transportation: Private Auto Blood Pressure (mmHg): 164/90 Accompanied By: daughter Schedule  Follow-up Appointment: Yes Clinical Summary of Care: Patient Declined Notes Patient was given a surgical shoe to wear home Electronic Signature(s) Signed: 04/10/2022 4:44:19 PM By: Karie Schwalbe RN Entered By: Karie Schwalbe on 04/10/2022 16:41:40 -------------------------------------------------------------------------------- Lower Extremity Assessment Details Patient Name: Date of Service: Ashley Stone, Ashley Stone 04/10/2022 8:00 A M Medical Record Number: [id number] Patient Account Number: [id number] Date of Birth/Sex: Treating RN: 16-Dec-1945 (76 y.o. Woodroe Mode, Randa Evens Primary Care Enjoli Tidd: Majel Homer Other Clinician: Referring Misha Vanoverbeke: Treating Sarha Bartelt/Extender: Jonah Blue in Treatment: 0 Edema Assessment Assessed: [Left: No] [Right: No] Edema: [Left: Yes] [Right: Yes] Calf Left: Right: Point of Measurement: 35 cm From Medial Instep 41 cm 43 cm Ankle Left: Right: Point of Measurement: 10 cm From Medial Instep 21.5 cm 21.8 cm Knee To Floor Left: Right: From Medial Instep 44 cm 44 cm Vascular Assessment Pulses: Dorsalis Pedis Palpable: [Left:Yes] Blood Pressure: Brachial: [Left:164] Ankle: [Left:Dorsalis Pedis: 220 1.34] Electronic Signature(s) Signed: 04/10/2022 4:44:19 PM By: Karie Schwalbe RN Entered By: Karie Schwalbe on 04/10/2022 09:23:53 -------------------------------------------------------------------------------- Multi Wound Chart Details Patient Name: Date of Service: Samantha Crimes 04/10/2022 8:00 A M Medical Record Number: [id number] Patient Account Number: [id number] Date of Birth/Sex: Treating RN: 1945/12/25 (76 y.o. F) Primary Care Sania Noy: Majel Homer Other Clinician: Referring [place]: Treating Jones Viviani/Extender: Jonah Blue in Treatment: 0 Vital Signs Height(in): 70 Pulse(bpm): 89 Weight(lbs): Blood Pressure(mmHg): 164/90 Body Mass Index(BMI): Temperature(F):  98 Respiratory Rate(breaths/min): 18 Photos: [N/A:N/A] Left, Posterior Lower Leg N/A N/A Wound Location: Laceration N/A N/A Wounding Event: Trauma, Other N/A N/A Primary Etiology: Anemia, Arrhythmia, Congestive Heart N/A N/A Comorbid History: Failure, Hypertension, Type II Diabetes, End Stage Renal Disease 03/02/2022 N/A N/A Date Acquired: 0 N/A N/A Weeks of Treatment: Open N/A N/A Wound Status: No N/A N/A Wound Recurrence: 3x3.7x0.2 N/A N/A Measurements L x W x D (cm) 8.718 N/A N/A A (cm) : rea 1.744 N/A N/A Volume (cm) : 0.00% N/A N/A % Reduction in Area: 0.00% N/A N/A % Reduction in Volume: Full Thickness Without Exposed N/A N/A Classification: Support Structures Medium N/A N/A Exudate A mount: Serosanguineous N/A N/A Exudate Type: red, brown N/A N/A Exudate Color: Medium (34-66%) N/A N/A Granulation A mount: Red, Pink N/A N/A Granulation Quality: Medium (34-66%) N/A N/A Necrotic A mount: Eschar, Adherent Slough N/A N/A Necrotic Tissue: Fat Layer (Subcutaneous Tissue): Yes N/A N/A Exposed Structures:  Fascia: No Tendon: No Muscle: No Joint: No Bone: No Small (1-33%) N/A N/A Epithelialization: Debridement - Excisional N/A N/A Debridement: Pre-procedure Verification/Time Out 08:55 N/A N/A Taken: Lidocaine 5% topical ointment N/A N/A Pain Control: Necrotic/Eschar, Subcutaneous, N/A N/A Tissue Debrided: Slough Skin/Subcutaneous Tissue N/A N/A Level: 11.1 N/A N/A Debridement A (sq cm): rea Curette N/A N/A Instrument: Minimum N/A N/A Bleeding: Pressure N/A N/A Hemostasis Achieved: 0 N/A N/A Procedural Pain: 0 N/A N/A Post Procedural Pain: Debridement Treatment Response: Procedure was tolerated well N/A N/A Post Debridement Measurements L x 3x3.7x0.2 N/A N/A W x D (cm) 1.744 N/A N/A Post Debridement Volume: (cm) Debridement N/A N/A Procedures Performed: Treatment Notes Electronic Signature(s) Signed: 04/10/2022 9:17:43 AM By:  Duanne Guess MD FACS Entered By: Duanne Guess on 04/10/2022 09:17:42 -------------------------------------------------------------------------------- Multi-Disciplinary Care Plan Details Patient Name: Date of Service: Samantha Crimes 04/10/2022 8:00 A M Medical Record Number: [id number] Patient Account Number: [id number] Date of Birth/Sex: Treating RN: May 16, 1946 (76 y.o. [name] Primary Care Adrin Julian: Majel Homer Other Clinician: Referring Jeanne Diefendorf: Treating Oneita Allmon/Extender: Jonah Blue in Treatment: 0 Active Inactive Wound/Skin Impairment Nursing Diagnoses: Impaired tissue integrity Goals: Patient/caregiver will verbalize understanding of skin care regimen Date Initiated: 04/10/2022 Target Resolution Date: 06/30/2022 Goal Status: Active Interventions: Assess ulceration(s) every visit Treatment Activities: Skin care regimen initiated : 04/10/2022 Notes: Electronic Signature(s) Signed: 04/10/2022 4:44:19 PM By: Karie Schwalbe RN Entered By: Karie Schwalbe on 04/10/2022 16:37:09 -------------------------------------------------------------------------------- Pain Assessment Details Patient Name: Date of Service: Ashley Stone, Ashley Stone 04/10/2022 8:00 A M Medical Record Number: [id number] Patient Account Number: [id number] Date of Birth/Sex: Treating RN: 1946/08/14 (76 y.o. [name] Primary Care Roselynn Whitacre: Majel Homer Other Clinician: Referring Farzad Tibbetts: Treating Justn Quale/Extender: Jonah Blue in Treatment: 0 Active Problems Location of Pain Severity and Description of Pain Patient Has Paino No Site Locations Pain Management and Medication Current Pain Management: Electronic Signature(s) Signed: 04/10/2022 4:44:19 PM By: Karie Schwalbe RN Entered By: Karie Schwalbe on 04/10/2022  08:44:08 -------------------------------------------------------------------------------- Patient/Caregiver Education Details Patient Name: Date of Service: Samantha Crimes 8/11/2023andnbsp8:00 A M Medical Record Number: [id number] Patient Account Number: [id number] Date of Birth/Gender: Treating RN: 04-19-1946 (76 y.o. [name] Primary Care Physician: Majel Homer Other Clinician: Referring Physician: Treating Physician/Extender: Jonah Blue in Treatment: 0 Education Assessment Education Provided To: Patient Education Topics Provided Wound/Skin Impairment: Methods: Explain/Verbal Responses: Return demonstration correctly Electronic Signature(s) Signed: 04/10/2022 4:44:19 PM By: Karie Schwalbe RN Entered By: Karie Schwalbe on 04/10/2022 16:37:19 -------------------------------------------------------------------------------- Wound Assessment Details Patient Name: Date of Service: Ashley Stone, Ashley Stone 04/10/2022 8:00 A M Medical Record Number: [id number] Patient Account Number: [id number] Date of Birth/Sex: Treating RN: April 30, 1946 (76 y.o. [name] Primary Care Daison Braxton: Majel Homer Other Clinician: Referring Hersey Maclellan: Treating Genifer Lazenby/Extender: Jonah Blue in Treatment: 0 Wound Status Wound Number: 1 Primary Trauma, Other Etiology: Wound Location: Left, Posterior Lower Leg Wound Open Wounding Event: Laceration Status: Date Acquired: 03/02/2022 Comorbid Anemia, Arrhythmia, Congestive Heart Failure, Hypertension, Weeks Of Treatment: 0 History: Type II Diabetes, End Stage Renal Disease Clustered Wound: No Photos Wound Measurements Length: (cm) 3 Width: (cm) 3.7 Depth: (cm) 0.2 Area: (cm) 8.718 Volume: (cm) 1.744 % Reduction in Area: 0% % Reduction in Volume: 0% Epithelialization: Small (1-33%) Tunneling: No Undermining: No Wound Description Classification: Full Thickness Without  Exposed Support Structures Exudate Amount: Medium Exudate Type: Serosanguineous Exudate Color: red, brown Foul Odor After Cleansing: No Slough/Fibrino Yes Wound Bed Granulation Amount: Medium (34-66%) Exposed Structure  Granulation Quality: Red, Pink Fascia Exposed: No Necrotic Amount: Medium (34-66%) Fat Layer (Subcutaneous Tissue) Exposed: Yes Necrotic Quality: Eschar, Adherent Slough Tendon Exposed: No Muscle Exposed: No Joint Exposed: No Bone Exposed: No Treatment Notes Wound #1 (Lower Leg) Wound Laterality: Left, Posterior Cleanser Soap and Water Discharge Instruction: May shower and wash wound with dial antibacterial soap and water prior to dressing change. Wound Cleanser Discharge Instruction: Cleanse the wound with wound cleanser prior to applying a clean dressing using gauze sponges, not tissue or cotton balls. Peri-Wound Care Topical Primary Dressing Santyl Ointment Discharge Instruction: Apply nickel thick amount to wound bed as instructed Secondary Dressing ABD Pad, 8x10 Discharge Instruction: Apply over primary dressing as directed. Woven Gauze Sponge, Non-Sterile 4x4 in Discharge Instruction: Apply over primary dressing as directed. Secured With Compression Wrap ThreePress (3 layer compression wrap) Discharge Instruction: Apply three layer compression as directed. Tubular Net Compression Stockings Add-Ons Electronic Signature(s) Signed: 04/10/2022 4:44:19 PM By: Karie Schwalbe RN Entered By: Karie Schwalbe on 04/10/2022 08:41:18 -------------------------------------------------------------------------------- Vitals Details Patient Name: Date of Service: Samantha Crimes 04/10/2022 8:00 A M Medical Record Number: [id number] Patient Account Number: [id number] Date of Birth/Sex: Treating RN: Mar 13, 1946 (76 y.o. [name] Primary Care Zaara Sprowl: Majel Homer Other Clinician: Referring Zaylia Riolo: Treating Edvin Albus/Extender: Jonah Blue in Treatment: 0 Vital Signs Time Taken: 08:14 Temperature (F): 98 Height (in): 70 Pulse (bpm): 89 Source: Stated Respiratory Rate (breaths/min): 18 Blood Pressure (mmHg): 164/90 Reference Range: 80 - 120 mg / dl Electronic Signature(s) Signed: 04/10/2022 4:44:19 PM By: Karie Schwalbe RN Entered By: Karie Schwalbe on 04/10/2022 08:43:58

## 2022-04-10 NOTE — Progress Notes (Addendum)
LEAN, LOOSLI ([id number]) Visit Report for 04/10/2022 Chief Complaint Document Details Patient Name: Date of Service: Ashley Stone, Ashley Stone 04/10/2022 8:00 A M Medical Record Number: [id number] Patient Account Number: [id number] Date of Birth/Sex: Treating RN: [birth year] (76 y.o. F) Primary Care Provider: Allie Dimmer Other Clinician: Referring Provider: Treating Provider/Extender: Claiborne Billings in Treatment: 0 Information Obtained from: Patient Chief Complaint Patient seen for complaints of Non-Healing Wound. Electronic Signature(s) Signed: 04/10/2022 9:18:02 AM By: Fredirick Maudlin MD FACS Entered By: Fredirick Maudlin on 04/10/2022 09:18:02 -------------------------------------------------------------------------------- Debridement Details Patient Name: Date of Service: Ashley Stone, Ashley Stone 04/10/2022 8:00 A M Medical Record Number: [id number] Patient Account Number: [id number] Date of Birth/Sex: Treating RN: [birth year] (76 y.o. America Brown Primary Care Provider: Allie Dimmer Other Clinician: Referring Provider: Treating Provider/Extender: Claiborne Billings in Treatment: 0 Debridement Performed for Assessment: Wound #1 Left,Posterior Lower Leg Performed By: Physician Fredirick Maudlin, MD Debridement Type: Debridement Level of Consciousness (Pre-procedure): Awake and Alert Pre-procedure Verification/Time Out Yes - 08:55 Taken: Start Time: 08:55 Pain Control: Lidocaine 5% topical ointment T Area Debrided (L x W): otal 3 (cm) x 3.7 (cm) = 11.1 (cm) Tissue and other material debrided: Non-Viable, Eschar, Slough, Subcutaneous, Slough Level: Skin/Subcutaneous Tissue Debridement Description: Excisional Instrument: Curette Bleeding: Minimum Hemostasis Achieved: Pressure End Time: 08:56 Procedural Pain: 0 Post Procedural Pain: 0 Response to Treatment: Procedure was tolerated well Level of Consciousness (Post- Awake and  Alert procedure): Post Debridement Measurements of Total Wound Length: (cm) 3 Width: (cm) 3.7 Depth: (cm) 0.2 Volume: (cm) 1.744 Character of Wound/Ulcer Post Debridement: Improved Post Procedure Diagnosis Same as Pre-procedure Electronic Signature(s) Signed: 04/10/2022 9:38:58 AM By: Fredirick Maudlin MD FACS Signed: 04/10/2022 4:44:19 PM By: Dellie Catholic RN Entered By: Dellie Catholic on 04/10/2022 08:57:03 -------------------------------------------------------------------------------- HPI Details Patient Name: Date of Service: Ashley Stone 04/10/2022 8:00 A M Medical Record Number: [id number] Patient Account Number: [id number] Date of Birth/Sex: Treating RN: 01/25/1946 (76 y.o. F) Primary Care Provider: Allie Dimmer Other Clinician: Referring Provider: Treating Provider/Extender: Claiborne Billings in Treatment: 0 History of Present Illness HPI Description: ADMISSION 04/10/2022 This is a 76 year old poorly controlled type II diabetic (last hemoglobin A1c 8.1% in July). She suffered a fall from her bed on July 3 which resulted in laceration of her posteromedial left leg. She was seen in the emergency department local to her where Steri-Strips were used to approximate the wound and she was given a course of Keflex. The wound has failed to heal since that time and her primary care provider referred her to the wound care center for further evaluation and management. ABI in clinic was 1.34. On her left posteromedial calf, there is a laceration with heavy eschar and slough accumulation. The periwound skin is intact. She has 1+ pitting edema. No erythema, induration, or purulent drainage/malodor to suggest infection. Electronic Signature(s) Signed: 04/10/2022 9:20:40 AM By: Fredirick Maudlin MD FACS Entered By: Fredirick Maudlin on 04/10/2022 09:20:40 -------------------------------------------------------------------------------- Physical Exam  Details Patient Name: Date of Service: Ashley Stone, Ashley Stone 04/10/2022 8:00 A M Medical Record Number: [id number] Patient Account Number: [id number] Date of Birth/Sex: Treating RN: 01-13-1946 (76 y.o. F) Primary Care Provider: Allie Dimmer Other Clinician: Referring Provider: Treating Provider/Extender: Claiborne Billings in Treatment: 0 Constitutional Hypertensive, asymptomatic. . . . No acute distress. Respiratory Normal work of breathing on room air.. Cardiovascular . 1+ pitting edema bilaterally. Skin changes consistent with venous stasis.. Notes 04/10/2022: On her left posteromedial calf, there is a laceration with  heavy eschar and slough accumulation. The periwound skin is intact. She has 1+ pitting edema. No erythema, induration, or purulent drainage/malodor to suggest infection. Electronic Signature(s) Signed: 04/10/2022 9:21:44 AM By: Duanne Guess MD FACS Signed: 04/10/2022 9:21:44 AM By: Duanne Guess MD FACS Previous Signature: 04/10/2022 9:21:17 AM Version By: Duanne Guess MD FACS Entered By: Duanne Guess on 04/10/2022 09:21:43 -------------------------------------------------------------------------------- Physician Orders Details Patient Name: Date of Service: Ashley Stone, Ashley Stone 04/10/2022 8:00 A M Medical Record Number: [id number] Patient Account Number: [id number] Date of Birth/Sex: Treating RN: 1945-12-28 (76 y.o. [name] Primary Care Provider: Majel Homer Other Clinician: Referring Provider: Treating Provider/Extender: Jonah Blue in Treatment: 0 Verbal / Phone Orders: No Diagnosis Coding ICD-10 Coding Code Description [phone] Non-pressure chronic ulcer of other part of left lower leg with fat layer exposed E11.622 Type 2 diabetes mellitus with other skin ulcer I73.9 Peripheral vascular disease, unspecified E11.65 Type 2 diabetes mellitus with hyperglycemia Follow-up  Appointments ppointment in 1 week. - Dr Lady Gary Room 3 Friday August 18th at 9:30am Return A Anesthetic (In clinic) Topical Lidocaine 5% applied to wound bed - Used in clinic [employer] May shower with protection but do not get wound dressing(s) wet. - Please do not get left leg wet use a cast protector on left leg if showering or taking a bath Cast Protectors cost around $20-$30 from [employer] etc, Edema Control - Lymphedema / SCD / Other Avoid standing for long periods of time. Moisturize legs daily. Compression stocking or Garment 20-30 mm/Hg pressure to: - both legs -if compression wraps are on left leg then do not use the left compression stocking on the left leg Additional Orders / Instructions Other: - When you are able, you can use the leg measurements to purchase compression stockings from [employer]( see pamphlet) Wound Treatment Wound #1 - Lower Leg Wound Laterality: Left, Posterior Cleanser: Soap and Water 1 x Per Week/30 Days Discharge Instructions: May shower and wash wound with dial antibacterial soap and water prior to dressing change. Cleanser: Wound Cleanser 1 x Per Week/30 Days Discharge Instructions: Cleanse the wound with wound cleanser prior to applying a clean dressing using gauze sponges, not tissue or cotton balls. Prim Dressing: Santyl Ointment 1 x Per Week/30 Days ary Discharge Instructions: Apply nickel thick amount to wound bed as instructed Secondary Dressing: ABD Pad, 8x10 1 x Per Week/30 Days Discharge Instructions: Apply over primary dressing as directed. Secondary Dressing: Woven Gauze Sponge, Non-Sterile 4x4 in 1 x Per Week/30 Days Discharge Instructions: Apply over primary dressing as directed. Compression Wrap: ThreePress (3 layer compression wrap) 1 x Per Week/30 Days Discharge Instructions: Apply three layer compression as directed. Compression Wrap: Tubular Net 1 x Per Week/30 Days Electronic Signature(s) Signed:  04/10/2022 9:38:58 AM By: Duanne Guess MD FACS Signed: 04/10/2022 9:38:58 AM By: Duanne Guess MD FACS Entered By: Duanne Guess on 04/10/2022 09:22:09 -------------------------------------------------------------------------------- Problem List Details Patient Name: Date of Service: Ashley Stone, Ashley Stone 04/10/2022 8:00 A M Medical Record Number: [id number] Patient Account Number: [id number] Date of Birth/Sex: Treating RN: May 26, 1946 (76 y.o. F) Primary Care Provider: Majel Homer Other Clinician: Referring Provider: Treating Provider/Extender: Jonah Blue in Treatment: 0 Active Problems ICD-10 Encounter Code Description Active Date MDM Diagnosis [phone] Non-pressure chronic ulcer of other part of left lower leg with fat layer exposed8/07/2022 No Yes E11.622 Type 2 diabetes mellitus with other skin ulcer 04/10/2022 No Yes I73.9 Peripheral vascular disease, unspecified 04/10/2022 No Yes E11.65 Type 2 diabetes mellitus  with hyperglycemia 04/10/2022 No Yes Inactive Problems Resolved Problems Electronic Signature(s) Signed: 04/10/2022 9:17:35 AM By: Fredirick Maudlin MD FACS Previous Signature: 04/10/2022 8:04:30 AM Version By: Fredirick Maudlin MD FACS Entered By: Fredirick Maudlin on 04/10/2022 09:17:35 -------------------------------------------------------------------------------- Progress Note Details Patient Name: Date of Service: Ashley Stone, Ashley Stone 04/10/2022 8:00 A M Medical Record Number: [id number] Patient Account Number: [id number] Date of Birth/Sex: Treating RN: 1945/09/14 (76 y.o. F) Primary Care Provider: Allie Dimmer Other Clinician: Referring Provider: Treating Provider/Extender: Claiborne Billings in Treatment: 0 Subjective Chief Complaint Information obtained from Patient Patient seen for complaints of Non-Healing Wound. History of Present Illness (HPI) ADMISSION 04/10/2022 This is a 76 year old poorly  controlled type II diabetic (last hemoglobin A1c 8.1% in July). She suffered a fall from her bed on July 3 which resulted in laceration of her posteromedial left leg. She was seen in the emergency department local to her where Steri-Strips were used to approximate the wound and she was given a course of Keflex. The wound has failed to heal since that time and her primary care provider referred her to the wound care center for further evaluation and management. ABI in clinic was 1.34. On her left posteromedial calf, there is a laceration with heavy eschar and slough accumulation. The periwound skin is intact. She has 1+ pitting edema. No erythema, induration, or purulent drainage/malodor to suggest infection. Patient History Information obtained from Patient. Allergies No Known Allergies Family History Unknown History. Social History Never smoker, Marital Status - Single, Alcohol Use - Never, Drug Use - No History, Caffeine Use - Moderate - tea. Medical History Hematologic/Lymphatic Patient has history of Anemia Cardiovascular Patient has history of Arrhythmia - Hx SVT Congestive Heart Failure, Hypertension , Endocrine Patient has history of Type II Diabetes Genitourinary Patient has history of End Stage Renal Disease - Stage 3 a chronic kidney disease Patient is treated with Insulin. Blood sugar results noted at the following times: Breakfast - 109. Hospitalization/Surgery History - Hysterectomy. Medical A Surgical History Notes nd Eyes Left eye vision problems (bee sting under eye from 04/03/22) Ear/Nose/Mouth/Throat Tinnitus Endocrine Hypothyroidism Review of Systems (ROS) Constitutional Symptoms (General Health) Denies complaints or symptoms of Fatigue, Fever, Chills, Marked Weight Change. Integumentary (Skin) Complains or has symptoms of Wounds - Left lower leg-laceration from a fall. Objective Constitutional Hypertensive, asymptomatic. No acute distress. Vitals Time  Taken: 8:14 AM, Height: 70 in, Source: Stated, Temperature: 98 F, Pulse: 89 bpm, Respiratory Rate: 18 breaths/min, Blood Pressure: 164/90 mmHg. Respiratory Normal work of breathing on room air.. Cardiovascular 1+ pitting edema bilaterally. Skin changes consistent with venous stasis.. General Notes: 04/10/2022: On her left posteromedial calf, there is a laceration with heavy eschar and slough accumulation. The periwound skin is intact. She has 1+ pitting edema. No erythema, induration, or purulent drainage/malodor to suggest infection. Integumentary (Hair, Skin) Wound #1 status is Open. Original cause of wound was Laceration. The date acquired was: 03/02/2022. The wound is located on the Left,Posterior Lower Leg. The wound measures 3cm length x 3.7cm width x 0.2cm depth; 8.718cm^2 area and 1.744cm^3 volume. There is Fat Layer (Subcutaneous Tissue) exposed. There is no tunneling or undermining noted. There is a medium amount of serosanguineous drainage noted. There is medium (34-66%) red, pink granulation within the wound bed. There is a medium (34-66%) amount of necrotic tissue within the wound bed including Eschar and Adherent Slough. Assessment Active Problems ICD-10 Non-pressure chronic ulcer of other part of left lower leg with fat layer exposed Type 2 diabetes  mellitus with other skin ulcer Peripheral vascular disease, unspecified Type 2 diabetes mellitus with hyperglycemia Procedures Wound #1 Pre-procedure diagnosis of Wound #1 is a Trauma, Other located on the Left,Posterior Lower Leg . There was a Excisional Skin/Subcutaneous Tissue Debridement with a total area of 11.1 sq cm performed by Duanne Guess, MD. With the following instrument(s): Curette to remove Non-Viable tissue/material. Material removed includes Eschar, Subcutaneous Tissue, and Slough after achieving pain control using Lidocaine 5% topical ointment. No specimens were taken. A time out was conducted at 08:55, prior to  the start of the procedure. A Minimum amount of bleeding was controlled with Pressure. The procedure was tolerated well with a pain level of 0 throughout and a pain level of 0 following the procedure. Post Debridement Measurements: 3cm length x 3.7cm width x 0.2cm depth; 1.744cm^3 volume. Character of Wound/Ulcer Post Debridement is improved. Post procedure Diagnosis Wound #1: Same as Pre-Procedure Plan Follow-up Appointments: Return Appointment in 1 week. - Dr Lady Gary Room 3 Friday August 18th at 9:30am Anesthetic: (In clinic) Topical Lidocaine 5% applied to wound bed - Used in clinic Bathing/ Shower/ Hygiene: May shower with protection but do not get wound dressing(s) wet. - Please do not get left leg wet use a cast protector on left leg if showering or taking a bath Cast Protectors cost around $20-$30 from [employer] etc, Edema Control - Lymphedema / SCD / Other: Avoid standing for long periods of time. Moisturize legs daily. Compression stocking or Garment 20-30 mm/Hg pressure to: - both legs -if compression wraps are on left leg then do not use the left compression stocking on the left leg Additional Orders / Instructions: Other: - When you are able, you can use the leg measurements to purchase compression stockings from [employer]( see pamphlet) WOUND #1: - Lower Leg Wound Laterality: Left, Posterior Cleanser: Soap and Water 1 x Per Week/30 Days Discharge Instructions: May shower and wash wound with dial antibacterial soap and water prior to dressing change. Cleanser: Wound Cleanser 1 x Per Week/30 Days Discharge Instructions: Cleanse the wound with wound cleanser prior to applying a clean dressing using gauze sponges, not tissue or cotton balls. Prim Dressing: Santyl Ointment 1 x Per Week/30 Days ary Discharge Instructions: Apply nickel thick amount to wound bed as instructed Secondary Dressing: ABD Pad, 8x10 1 x Per Week/30 Days Discharge Instructions: Apply over  primary dressing as directed. Secondary Dressing: Woven Gauze Sponge, Non-Sterile 4x4 in 1 x Per Week/30 Days Discharge Instructions: Apply over primary dressing as directed. Com pression Wrap: ThreePress (3 layer compression wrap) 1 x Per Week/30 Days Discharge Instructions: Apply three layer compression as directed. Com pression Wrap: Tubular Net 1 x Per Week/30 Days 04/10/2022: This is a 75 year old diabetic patient who suffered a laceration to her leg when she fell out of bed. On her left posteromedial calf, there is a laceration with heavy eschar and slough accumulation. The periwound skin is intact. She has 1+ pitting edema. No erythema, induration, or purulent drainage/malodor to suggest infection. I used a curette to debride slough, eschar, and nonviable subcutaneous tissue from the wound. Debridement was somewhat limited secondary to patient intolerance. I am going to use Santyl for further enzymatic debridement of the wound. Apply 3 layer compression. She would also benefit from long-term use of compression stockings. She was measured for these today and provided the information for elastic [facility] in [facility]. She was advised to elevate her legs throughout the day and at night is much as possible.  She will follow-up in 1 week. Electronic Signature(s) Signed: 04/10/2022 9:23:09 AM By: Fredirick Maudlin MD FACS Entered By: Fredirick Maudlin on 04/10/2022 09:23:09 -------------------------------------------------------------------------------- HxROS Details Patient Name: Date of Service: Ashley Stone, Ashley Stone 04/10/2022 8:00 A M Medical Record Number: [id number] Patient Account Number: [id number] Date of Birth/Sex: Treating RN: May 10, 1946 (76 y.o. America Brown Primary Care Provider: Allie Dimmer Other Clinician: Referring Provider: Treating Provider/Extender: Claiborne Billings in Treatment: 0 Information Obtained From Patient Constitutional  Symptoms (General Health) Complaints and Symptoms: Negative for: Fatigue; Fever; Chills; Marked Weight Change Integumentary (Skin) Complaints and Symptoms: Positive for: Wounds - Left lower leg-laceration from a fall Eyes Medical History: Past Medical History Notes: Left eye vision problems (bee sting under eye from 04/03/22) Ear/Nose/Mouth/Throat Medical History: Past Medical History Notes: Tinnitus Hematologic/Lymphatic Medical History: Positive for: Anemia Respiratory Cardiovascular Medical History: Positive for: Arrhythmia - Hx SVT Congestive Heart Failure; Hypertension ; Endocrine Medical History: Positive for: Type II Diabetes Past Medical History Notes: Hypothyroidism Time with diabetes: 2000 Treated with: Insulin Blood sugar testing results: Breakfast: 109 Genitourinary Medical History: Positive for: End Stage Renal Disease - Stage 3 a chronic kidney disease Oncologic Immunizations Pneumococcal Vaccine: Received Pneumococcal Vaccination: Yes Received Pneumococcal Vaccination On or After 60th Birthday: Yes Tetanus Vaccine: Last tetanus shot: 03/02/2022 Implantable Devices No devices added Hospitalization / Surgery History Type of Hospitalization/Surgery Hysterectomy Family and Social History Unknown History: Yes; Never smoker; Marital Status - Single; Alcohol Use: Never; Drug Use: No History; Caffeine Use: Moderate - tea; Financial Concerns: No; Food, Clothing or Shelter Needs: No; Support System Lacking: No; Transportation Concerns: No Electronic Signature(s) Signed: 04/10/2022 9:38:58 AM By: Fredirick Maudlin MD FACS Signed: 04/10/2022 4:44:19 PM By: Dellie Catholic RN Entered By: Dellie Catholic on 04/10/2022 08:24:02 -------------------------------------------------------------------------------- SuperBill Details Patient Name: Date of Service: Ashley Stone, Ashley Stone 04/10/2022 Medical Record Number: [id number] Patient Account Number: [id number] Date of  Birth/Sex: Treating RN: 1945/09/12 (76 y.o. F) Primary Care Provider: Allie Dimmer Other Clinician: Referring Provider: Treating Provider/Extender: Claiborne Billings in Treatment: 0 Diagnosis Coding ICD-10 Codes Code Description [phone] Non-pressure chronic ulcer of other part of left lower leg with fat layer exposed E11.622 Type 2 diabetes mellitus with other skin ulcer I73.9 Peripheral vascular disease, unspecified E11.65 Type 2 diabetes mellitus with hyperglycemia Facility Procedures CPT4 Code: [id number] Description: [place] VISIT-LEV 3 EST PT Modifier: 25 Quantity: 1 CPT4 Code: [id number] Description: [id number] - DEB SUBQ TISSUE 20 SQ CM/< ICD-10 Diagnosis Description L97.822 Non-pressure chronic ulcer of other part of left lower leg with fat layer expos Modifier: ed Quantity: 1 Physician Procedures : CPT4 Code Description Modifier [id number] - WC PHYS LEVEL 4 - NEW PT 25 ICD-10 Diagnosis Description L97.822 Non-pressure chronic ulcer of other part of left lower leg with fat layer exposed E11.622 Type 2 diabetes mellitus with other skin ulcer  I73.9 Peripheral vascular disease, unspecified E11.65 Type 2 diabetes mellitus with hyperglycemia Quantity: 1 : [id number] - WC PHYS SUBQ TISS 20 SQ CM ICD-10 Diagnosis Description L97.822 Non-pressure chronic ulcer of other part of left lower leg with fat layer exposed Quantity: 1 Electronic Signature(s) Signed: 04/10/2022 4:44:19 PM By: Dellie Catholic RN Signed: 04/13/2022 7:31:11 AM By: Fredirick Maudlin MD FACS Previous Signature: 04/10/2022 9:23:29 AM Version By: Fredirick Maudlin MD FACS Entered By: Dellie Catholic on 04/10/2022 16:40:07

## 2022-04-10 NOTE — Progress Notes (Signed)
Ashley Stone, Ashley Stone ([id number]) Visit Report for 04/10/2022 Abuse Risk Screen Details Patient Name: Date of Service: Ashley Stone, Ashley Stone 04/10/2022 8:00 A M Medical Record Number: [id number] Patient Account Number: [id number] Date of Birth/Sex: Treating RN: 09-29-1945 (76 y.o. [name] Primary Care Renarda Mullinix: Majel Homer Other Clinician: Referring Glennon Kopko: Treating Chrystian Ressler/Extender: Jonah Blue in Treatment: 0 Abuse Risk Screen Items Answer ABUSE RISK SCREEN: Has anyone close to you tried to hurt or harm you recentlyo No Do you feel uncomfortable with anyone in your familyo No Has anyone forced you do things that you didnt want to doo No Electronic Signature(s) Signed: 04/10/2022 4:44:19 PM By: Karie Schwalbe RN Entered By: Karie Schwalbe on 04/10/2022 08:24:11 -------------------------------------------------------------------------------- Activities of Daily Living Details Patient Name: Date of Service: Ashley Stone, Ashley Stone 04/10/2022 8:00 A M Medical Record Number: [id number] Patient Account Number: [id number] Date of Birth/Sex: Treating RN: Nov 10, 1945 (76 y.o. [name] Primary Care Nanie Dunkleberger: Majel Homer Other Clinician: Referring Sherryll Skoczylas: Treating Kahil Agner/Extender: Jonah Blue in Treatment: 0 Activities of Daily Living Items Answer Activities of Daily Living (Please select one for each item) Drive Automobile Not Able T Medications ake Completely Able Use T elephone Completely Able Care for Appearance Completely Able Use T oilet Completely Able Bath / Shower Completely Able Dress Self Completely Able Feed Self Completely Able Walk Need Assistance Get In / Out Bed Completely Able Housework Completely Able Prepare Meals Completely Able Handle Money Completely Able Shop for Self Need Assistance Notes Uses rolling walker or cane Electronic Signature(s) Signed: 04/10/2022 4:44:19 PM By: Karie Schwalbe RN Signed: 04/10/2022 4:44:19 PM By: Karie Schwalbe RN Entered By: Karie Schwalbe on 04/10/2022 08:25:23 -------------------------------------------------------------------------------- Education Screening Details Patient Name: Date of Service: Ashley Stone, Ashley Stone 04/10/2022 8:00 A M Medical Record Number: [id number] Patient Account Number: [id number] Date of Birth/Sex: Treating RN: 10-30-1945 (76 y.o. [name] Primary Care Janos Shampine: Majel Homer Other Clinician: Referring Teondre Jarosz: Treating Jany Buckwalter/Extender: Jonah Blue in Treatment: 0 Learning Preferences/Education Level/Primary Language Learning Preference: Explanation, Demonstration, Printed Material Preferred Language: English Cognitive Barrier Language Barrier: No Translator Needed: No Memory Deficit: No Emotional Barrier: No Cultural/Religious Beliefs Affecting Medical Care: No Physical Barrier Impaired Vision: Yes left eye Impaired Hearing: No Decreased Hand dexterity: No Knowledge/Comprehension Knowledge Level: High Comprehension Level: High Ability to understand written instructions: High Ability to understand verbal instructions: High Motivation Anxiety Level: Calm Cooperation: Cooperative Education Importance: Acknowledges Need Interest in Health Problems: Asks Questions Perception: Coherent Willingness to Engage in Self-Management High Activities: Readiness to Engage in Self-Management High Activities: Electronic Signature(s) Signed: 04/10/2022 4:44:19 PM By: Karie Schwalbe RN Entered By: Karie Schwalbe on 04/10/2022 08:27:14 -------------------------------------------------------------------------------- Fall Risk Assessment Details Patient Name: Date of Service: Ashley Stone 04/10/2022 8:00 A M Medical Record Number: [id number] Patient Account Number: [id number] Date of Birth/Sex: Treating RN: October 29, 1945 (76 y.o. [name] Primary Care  Tikia Skilton: Majel Homer Other Clinician: Referring Mykala Mccready: Treating Shyla Gayheart/Extender: Jonah Blue in Treatment: 0 Fall Risk Assessment Items Have you had 2 or more falls in the last 12 monthso 0 No Have you had any fall that resulted in injury in the last 12 monthso 0 Yes FALLS RISK SCREEN History of falling - immediate or within 3 months 0 No Secondary diagnosis (Do you have 2 or more medical diagnoseso) 0 No Ambulatory aid None/bed rest/wheelchair/nurse 0 No Crutches/cane/walker 0 No Furniture 0 No Intravenous therapy Access/Saline/Heparin Lock 0 No Gait/Transferring Normal/ bed rest/ wheelchair 0 No Weak (  short steps with or without shuffle, stooped but able to lift head while walking, may seek 0 No support from furniture) Impaired (short steps with shuffle, may have difficulty arising from chair, head down, impaired 0 No balance) Mental Status Oriented to own ability 0 No Electronic Signature(s) Signed: 04/10/2022 4:44:19 PM By: Karie Schwalbe RN Entered By: Karie Schwalbe on 04/10/2022 08:27:23 -------------------------------------------------------------------------------- Foot Assessment Details Patient Name: Date of Service: Ashley Stone, Ashley Stone 04/10/2022 8:00 A M Medical Record Number: [id number] Patient Account Number: [id number] Date of Birth/Sex: Treating RN: 1946-06-10 (76 y.o. [name] Primary Care Farooq Petrovich: Majel Homer Other Clinician: Referring Jermone Geister: Treating Sora Olivo/Extender: Jonah Blue in Treatment: 0 Foot Assessment Items Site Locations + = Sensation present, - = Sensation absent, C = Callus, U = Ulcer R = Redness, W = Warmth, M = Maceration, PU = Pre-ulcerative lesion F = Fissure, S = Swelling, D = Dryness Assessment Right: Left: Other Deformity: No No Prior Foot Ulcer: No No Prior Amputation: No No Charcot Joint: No No Ambulatory Status: Ambulatory With  Help Assistance Device: Cane Gait: Steady Electronic Signature(s) Signed: 04/10/2022 4:44:19 PM By: Karie Schwalbe RN Entered By: Karie Schwalbe on 04/10/2022 08:28:01 -------------------------------------------------------------------------------- Nutrition Risk Screening Details Patient Name: Date of Service: Ashley Stone, Ashley Stone 04/10/2022 8:00 A M Medical Record Number: [id number] Patient Account Number: [id number] Date of Birth/Sex: Treating RN: Oct 23, 1945 (76 y.o. [name] Primary Care Lavell Ridings: Majel Homer Other Clinician: Referring Liev Brockbank: Treating Vandell Kun/Extender: Jonah Blue in Treatment: 0 Height (in): 70 Weight (lbs): Body Mass Index (BMI): Nutrition Risk Screening Items Score Screening NUTRITION RISK SCREEN: I have an illness or condition that made me change the kind and/or amount of food I eat 0 No I eat fewer than two meals per day 0 No I eat few fruits and vegetables, or milk products 0 No I have three or more drinks of beer, liquor or wine almost every day 0 No I have tooth or mouth problems that make it hard for me to eat 0 No I don't always have enough money to buy the food I need 0 No I eat alone most of the time 0 No I take three or more different prescribed or over-the-counter drugs a day 0 No Without wanting to, I have lost or gained 10 pounds in the last six months 0 No I am not always physically able to shop, cook and/or feed myself 0 No Nutrition Protocols Good Risk Protocol 0 No interventions needed Moderate Risk Protocol High Risk Proctocol Risk Level: Good Risk Score: 0 Electronic Signature(s) Signed: 04/10/2022 4:44:19 PM By: Karie Schwalbe RN Entered By: Karie Schwalbe on 04/10/2022 08:27:34

## 2022-04-17 ENCOUNTER — Encounter (HOSPITAL_BASED_OUTPATIENT_CLINIC_OR_DEPARTMENT_OTHER): Payer: Medicaid Other | Admitting: General Surgery

## 2022-04-17 DIAGNOSIS — E11622 Type 2 diabetes mellitus with other skin ulcer: Secondary | ICD-10-CM | POA: Diagnosis not present

## 2022-04-17 NOTE — Progress Notes (Signed)
Ashley Stone, Ashley Stone ([id number]) Visit Report for 04/17/2022 Arrival Information Details Patient Name: Date of Service: Ashley Stone, Ashley Stone 04/17/2022 9:30 A M Medical Record Number: [id number] Patient Account Number: [id number] Date of Birth/Sex: Treating RN: 1945-10-11 (76 y.o. [name] Primary Care Tevis Dunavan: Majel Homer Other Clinician: Referring Shara Hartis: Treating Grey Schlauch/Extender: Jonah Blue in Treatment: 1 Visit Information History Since Last Visit Added or deleted any medications: No Patient Arrived: Gil[place] Mor Any new allergies or adverse reactions: No Arrival Time: 09:54 Had a fall or experienced change in No Accompanied By: daughter activities of daily living that may affect Transfer Assistance: None risk of falls: Patient Identification Verified: Yes Signs or symptoms of abuse/neglect since last visito No Hospitalized since last visit: No Implantable device outside of the clinic excluding No cellular tissue based products placed in the center since last visit: Has Dressing in Place as Prescribed: Yes Has Compression in Place as Prescribed: Yes Pain Present Now: Yes Electronic Signature(s) Signed: 04/17/2022 5:23:24 PM By: Karie Schwalbe RN Entered By: Karie Schwalbe on 04/17/2022 09:55:04 -------------------------------------------------------------------------------- Compression Therapy Details Patient Name: Date of Service: DANESHA, KIRCHOFF 04/17/2022 9:30 A M Medical Record Number: [id number] Patient Account Number: [id number] Date of Birth/Sex: Treating RN: [birth year] (76 y.o. [name] Primary Care Maily Debarge: Majel Homer Other Clinician: Referring Jonny Longino: Treating Casper Pagliuca/Extender: Jonah Blue in Treatment: 1 Compression Therapy Performed for Wound Assessment: Wound #1 Left,Posterior Lower Leg Performed By: Clinician Karie Schwalbe, RN Compression Type: Three Layer Post Procedure  Diagnosis Same as Pre-procedure Electronic Signature(s) Signed: 04/17/2022 5:23:24 PM By: Karie Schwalbe RN Entered By: Karie Schwalbe on 04/17/2022 10:15:02 -------------------------------------------------------------------------------- Encounter Discharge Information Details Patient Name: Date of Service: Ashley Stone, Ashley Stone 04/17/2022 9:30 A M Medical Record Number: [id number] Patient Account Number: [id number] Date of Birth/Sex: Treating RN: 1946/01/21 (76 y.o. [name] Primary Care Dylana Shaw: Majel Homer Other Clinician: Referring Keysi Oelkers: Treating Tammie Yanda/Extender: Jonah Blue in Treatment: 1 Encounter Discharge Information Items Post Procedure Vitals Discharge Condition: Stable Temperature (F): 99.1 Ambulatory Status: Cane Pulse (bpm): 93 Discharge Destination: Home Respiratory Rate (breaths/min): 18 Transportation: Private Auto Blood Pressure (mmHg): 182/82 Accompanied By: daughter Schedule Follow-up Appointment: Yes Clinical Summary of Care: Patient Declined Electronic Signature(s) Signed: 04/17/2022 5:23:24 PM By: Karie Schwalbe RN Entered By: Karie Schwalbe on 04/17/2022 17:22:50 -------------------------------------------------------------------------------- Lower Extremity Assessment Details Patient Name: Date of Service: Ashley Stone, Ashley Stone 04/17/2022 9:30 A M Medical Record Number: [id number] Patient Account Number: [id number] Date of Birth/Sex: Treating RN: 19-Apr-1946 (76 y.o. [name] Primary Care Sten Dematteo: Majel Homer Other Clinician: Referring Cadin Luka: Treating Simrin Vegh/Extender: Jonah Blue in Treatment: 1 Edema Assessment Assessed: [Left: No] [Right: No] Edema: [Left: Yes] [Right: Yes] Calf Left: Right: Point of Measurement: 35 cm From Medial Instep 38.6 cm 43 cm Ankle Left: Right: Point of Measurement: 10 cm From Medial Instep 20.3 cm 21.8 cm Vascular  Assessment Pulses: Dorsalis Pedis Palpable: [Left:Yes] Electronic Signature(s) Signed: 04/17/2022 5:23:24 PM By: Karie Schwalbe RN Entered By: Karie Schwalbe on 04/17/2022 09:58:22 -------------------------------------------------------------------------------- Multi Wound Chart Details Patient Name: Date of Service: Ashley Stone 04/17/2022 9:30 A M Medical Record Number: [id number] Patient Account Number: [id number] Date of Birth/Sex: Treating RN: 04/17/1946 (76 y.o. [name] Primary Care Irvin Lizama: Majel Homer Other Clinician: Referring Darnell Jeschke: Treating Chelly Dombeck/Extender: Jonah Blue in Treatment: 1 Vital Signs Height(in): 70 Pulse(bpm): 93 Weight(lbs): Blood Pressure(mmHg): 182/82 Body Mass Index(BMI): Temperature(F): 99.1 Respiratory Rate(breaths/min): 18 Photos: [N/A:N/A] Left, Posterior Lower Leg N/A  N/A Wound Location: Laceration N/A N/A Wounding Event: Trauma, Other N/A N/A Primary Etiology: Anemia, Arrhythmia, Congestive Heart N/A N/A Comorbid History: Failure, Hypertension, Type II Diabetes, End Stage Renal Disease 03/02/2022 N/A N/A Date Acquired: 1 N/A N/A Weeks of Treatment: Open N/A N/A Wound Status: No N/A N/A Wound Recurrence: 3x3.5x0.2 N/A N/A Measurements L x W x D (cm) 8.247 N/A N/A A (cm) : rea 1.649 N/A N/A Volume (cm) : 5.40% N/A N/A % Reduction in A rea: 5.40% N/A N/A % Reduction in Volume: Full Thickness Without Exposed N/A N/A Classification: Support Structures Medium N/A N/A Exudate A mount: Serosanguineous N/A N/A Exudate Type: red, brown N/A N/A Exudate Color: Medium (34-66%) N/A N/A Granulation A mount: Red, Pink N/A N/A Granulation Quality: Medium (34-66%) N/A N/A Necrotic A mount: Eschar, Adherent Slough N/A N/A Necrotic Tissue: Fat Layer (Subcutaneous Tissue): Yes N/A N/A Exposed Structures: Fascia: No Tendon: No Muscle: No Joint: No Bone: No Small (1-33%)  N/A N/A Epithelialization: Debridement - Excisional N/A N/A Debridement: Pre-procedure Verification/Time Out 10:12 N/A N/A Taken: Lidocaine 4% Topical Solution N/A N/A Pain Control: Subcutaneous, Slough N/A N/A Tissue Debrided: Skin/Subcutaneous Tissue N/A N/A Level: 10.5 N/A N/A Debridement A (sq cm): rea Curette N/A N/A Instrument: Minimum N/A N/A Bleeding: Pressure N/A N/A Hemostasis A chieved: 0 N/A N/A Procedural Pain: 0 N/A N/A Post Procedural Pain: Procedure was tolerated well N/A N/A Debridement Treatment Response: 3x3.5x0.2 N/A N/A Post Debridement Measurements L x W x D (cm) 1.649 N/A N/A Post Debridement Volume: (cm) Compression Therapy N/A N/A Procedures Performed: Debridement Treatment Notes Electronic Signature(s) Signed: 04/17/2022 10:15:03 AM By: Duanne Guess MD FACS Signed: 04/17/2022 5:23:24 PM By: Karie Schwalbe RN Entered By: Duanne Guess on 04/17/2022 10:15:03 -------------------------------------------------------------------------------- Multi-Disciplinary Care Plan Details Patient Name: Date of Service: Ashley Stone, Ashley Stone 04/17/2022 9:30 A M Medical Record Number: [id number] Patient Account Number: [id number] Date of Birth/Sex: Treating RN: 1946-06-07 (76 y.o. [name] Primary Care Coryn Mosso: Majel Homer Other Clinician: Referring Jhania Etherington: Treating Hetty Linhart/Extender: Jonah Blue in Treatment: 1 Active Inactive Wound/Skin Impairment Nursing Diagnoses: Impaired tissue integrity Goals: Patient/caregiver will verbalize understanding of skin care regimen Date Initiated: 04/10/2022 Target Resolution Date: 06/30/2022 Goal Status: Active Interventions: Assess ulceration(s) every visit Treatment Activities: Skin care regimen initiated : 04/10/2022 Notes: Electronic Signature(s) Signed: 04/17/2022 5:23:24 PM By: Karie Schwalbe RN Entered By: Karie Schwalbe on 04/17/2022  17:21:06 -------------------------------------------------------------------------------- Pain Assessment Details Patient Name: Date of Service: Ashley Stone, Ashley Stone 04/17/2022 9:30 A M Medical Record Number: [id number] Patient Account Number: [id number] Date of Birth/Sex: Treating RN: [birth year] (76 y.o. [name] Primary Care Orpah Hausner: Majel Homer Other Clinician: Referring Viera Okonski: Treating Joei Frangos/Extender: Jonah Blue in Treatment: 1 Active Problems Location of Pain Severity and Description of Pain Patient Has Paino Yes Site Locations Site Locations Pain Location: Generalized Pain With Dressing Change: Yes Duration of the Pain. Constant / Intermittento Constant Rate the pain. Current Pain Level: 5 Worst Pain Level: 10 Least Pain Level: 5 Tolerable Pain Level: 5 Character of Pain Describe the Pain: Difficult to Pinpoint Pain Management and Medication Current Pain Management: Medication: Yes Cold Application: No Rest: Yes Massage: No Activity: No T.E.N.S.: No Heat Application: No Leg drop or elevation: No Is the Current Pain Management Adequate: Adequate How does your wound impact your activities of daily livingo Sleep: No Bathing: No Appetite: No Relationship With Others: No Bladder Continence: No Emotions: No Bowel Continence: No Work: No Toileting: No Drive: No Dressing: No Hobbies: No  Electronic Signature(s) Signed: 04/17/2022 5:23:24 PM By: Karie Schwalbe RN Entered By: Karie Schwalbe on 04/17/2022 09:56:28 -------------------------------------------------------------------------------- Patient/Caregiver Education Details Patient Name: Date of Service: Ashley Stone 8/18/2023andnbsp9:30 A M Medical Record Number: [id number] Patient Account Number: [id number] Date of Birth/Gender: Treating RN: 06-Mar-1946 (76 y.o. [name] Primary Care Physician: Majel Homer Other Clinician: Referring  Physician: Treating Physician/Extender: Jonah Blue in Treatment: 1 Education Assessment Education Provided To: Patient Education Topics Provided Wound/Skin Impairment: Methods: Explain/Verbal Responses: Return demonstration correctly Electronic Signature(s) Signed: 04/17/2022 5:23:24 PM By: Karie Schwalbe RN Signed: 04/17/2022 5:23:24 PM By: Karie Schwalbe RN Entered By: Karie Schwalbe on 04/17/2022 17:21:21 -------------------------------------------------------------------------------- Wound Assessment Details Patient Name: Date of Service: Ashley Stone, Ashley Stone 04/17/2022 9:30 A M Medical Record Number: [id number] Patient Account Number: [id number] Date of Birth/Sex: Treating RN: 10-11-1945 (76 y.o. [name] Primary Care Riti Rollyson: Majel Homer Other Clinician: Referring Marquel Pottenger: Treating Crimson Dubberly/Extender: Jonah Blue in Treatment: 1 Wound Status Wound Number: 1 Primary Trauma, Other Etiology: Wound Location: Left, Posterior Lower Leg Wound Open Wounding Event: Laceration Status: Date Acquired: 03/02/2022 Comorbid Anemia, Arrhythmia, Congestive Heart Failure, Hypertension, Weeks Of Treatment: 1 History: Type II Diabetes, End Stage Renal Disease Clustered Wound: No Photos Wound Measurements Length: (cm) 3 Width: (cm) 3.5 Depth: (cm) 0.2 Area: (cm) 8.247 Volume: (cm) 1.649 % Reduction in Area: 5.4% % Reduction in Volume: 5.4% Epithelialization: Small (1-33%) Tunneling: No Undermining: No Wound Description Classification: Full Thickness Without Exposed Support Structures Exudate Amount: Medium Exudate Type: Serosanguineous Exudate Color: red, brown Foul Odor After Cleansing: No Slough/Fibrino Yes Wound Bed Granulation Amount: Medium (34-66%) Exposed Structure Granulation Quality: Red, Pink Fascia Exposed: No Necrotic Amount: Medium (34-66%) Fat Layer (Subcutaneous Tissue) Exposed:  Yes Necrotic Quality: Eschar, Adherent Slough Tendon Exposed: No Muscle Exposed: No Joint Exposed: No Bone Exposed: No Treatment Notes Wound #1 (Lower Leg) Wound Laterality: Left, Posterior Cleanser Soap and Water Discharge Instruction: May shower and wash wound with dial antibacterial soap and water prior to dressing change. Wound Cleanser Discharge Instruction: Cleanse the wound with wound cleanser prior to applying a clean dressing using gauze sponges, not tissue or cotton balls. Peri-Wound Care Topical Primary Dressing Santyl Ointment Discharge Instruction: Apply nickel thick amount to wound bed as instructed Secondary Dressing ABD Pad, 8x10 Discharge Instruction: Apply over primary dressing as directed. Woven Gauze Sponge, Non-Sterile 4x4 in Discharge Instruction: Apply over primary dressing as directed. Secured With Compression Wrap ThreePress (3 layer compression wrap) Discharge Instruction: Apply three layer compression as directed. Tubular Net Compression Stockings Add-Ons Electronic Signature(s) Signed: 04/17/2022 5:23:24 PM By: Karie Schwalbe RN Entered By: Karie Schwalbe on 04/17/2022 10:01:58 -------------------------------------------------------------------------------- Vitals Details Patient Name: Date of Service: Ashley Stone, Ashley Stone 04/17/2022 9:30 A M Medical Record Number: [id number] Patient Account Number: [id number] Date of Birth/Sex: Treating RN: 1945/12/18 (76 y.o. [name] Primary Care Chelise Hanger: Majel Homer Other Clinician: Referring Bani Gianfrancesco: Treating Cerra Eisenhower/Extender: Jonah Blue in Treatment: 1 Vital Signs Time Taken: 09:53 Temperature (F): 99.1 Height (in): 70 Pulse (bpm): 93 Respiratory Rate (breaths/min): 18 Blood Pressure (mmHg): 182/82 Reference Range: 80 - 120 mg / dl Electronic Signature(s) Signed: 04/17/2022 5:23:24 PM By: Karie Schwalbe RN Entered By: Karie Schwalbe on 04/17/2022  10:06:57

## 2022-04-17 NOTE — Progress Notes (Signed)
Ashley Stone, Ashley Stone ([id number]) Visit Report for 04/17/2022 Chief Complaint Document Details Patient Name: Date of Service: Ashley Stone, Ashley Stone 04/17/2022 9:30 A M Medical Record Number: [id number] Patient Account Number: [id number] Date of Birth/Sex: Treating RN: [birth year] (76 y.o. [name] Primary Care Provider: Majel Homer Other Clinician: Referring Provider: Treating Provider/Extender: Jonah Blue in Treatment: 1 Information Obtained from: Patient Chief Complaint Patient seen for complaints of Non-Healing Wound. Electronic Signature(s) Signed: 04/17/2022 10:16:29 AM By: Duanne Guess MD FACS Entered By: Duanne Guess on 04/17/2022 10:16:29 -------------------------------------------------------------------------------- Debridement Details Patient Name: Date of Service: Ashley Stone, Ashley Stone 04/17/2022 9:30 A M Medical Record Number: [id number] Patient Account Number: [id number] Date of Birth/Sex: Treating RN: Dec 16, 1945 (76 y.o. [name] Primary Care Provider: Majel Homer Other Clinician: Referring Provider: Treating Provider/Extender: Jonah Blue in Treatment: 1 Debridement Performed for Assessment: Wound #1 Left,Posterior Lower Leg Performed By: Physician Duanne Guess, MD Debridement Type: Debridement Level of Consciousness (Pre-procedure): Awake and Alert Pre-procedure Verification/Time Out Yes - 10:12 Taken: Start Time: 10:12 Pain Control: Lidocaine 4% T opical Solution T Area Debrided (L x W): otal 3 (cm) x 3.5 (cm) = 10.5 (cm) Tissue and other material debrided: Non-Viable, Slough, Subcutaneous, Slough Level: Skin/Subcutaneous Tissue Debridement Description: Excisional Instrument: Curette Bleeding: Minimum Hemostasis Achieved: Pressure End Time: 10:13 Procedural Pain: 0 Post Procedural Pain: 0 Response to Treatment: Procedure was tolerated well Level of Consciousness (Post- Awake  and Alert procedure): Post Debridement Measurements of Total Wound Length: (cm) 3 Width: (cm) 3.5 Depth: (cm) 0.2 Volume: (cm) 1.649 Character of Wound/Ulcer Post Debridement: Improved Post Procedure Diagnosis Same as Pre-procedure Electronic Signature(s) Signed: 04/17/2022 12:36:36 PM By: Duanne Guess MD FACS Signed: 04/17/2022 5:23:24 PM By: Karie Schwalbe RN Entered By: Karie Schwalbe on 04/17/2022 10:14:45 -------------------------------------------------------------------------------- HPI Details Patient Name: Date of Service: Ashley Stone, Ashley Stone 04/17/2022 9:30 A M Medical Record Number: [id number] Patient Account Number: [id number] Date of Birth/Sex: Treating RN: 1946/04/21 (76 y.o. [name] Primary Care Provider: Majel Homer Other Clinician: Referring Provider: Treating Provider/Extender: Jonah Blue in Treatment: 1 History of Present Illness HPI Description: ADMISSION 04/10/2022 This is a 76 year old poorly controlled type II diabetic (last hemoglobin A1c 8.1% in July). She suffered a fall from her bed on July 3 which resulted in laceration of her posteromedial left leg. She was seen in the emergency department local to her where Steri-Strips were used to approximate the wound and she was given a course of Keflex. The wound has failed to heal since that time and her primary care provider referred her to the wound care center for further evaluation and management. ABI in clinic was 1.34. On her left posteromedial calf, there is a laceration with heavy eschar and slough accumulation. The periwound skin is intact. She has 1+ pitting edema. No erythema, induration, or purulent drainage/malodor to suggest infection. 04/17/2022: The wound is a bit narrower today but the length is roughly the same. There is still a fair amount of slough accumulation on the surface but it is less painful. Edema control is good. Electronic  Signature(s) Signed: 04/17/2022 10:17:08 AM By: Duanne Guess MD FACS Entered By: Duanne Guess on 04/17/2022 10:17:08 -------------------------------------------------------------------------------- Physical Exam Details Patient Name: Date of Service: Ashley Stone, Ashley Stone 04/17/2022 9:30 A M Medical Record Number: [id number] Patient Account Number: [id number] Date of Birth/Sex: Treating RN: 08-05-1946 (76 y.o. [name] Primary Care Provider: Majel Homer Other Clinician: Referring Provider: Treating Provider/Extender: Jonah Blue in Treatment:  1 Constitutional Hypertensive, asymptomatic. . . . No acute distress.Marland Kitchen Respiratory Normal work of breathing on room air.. Notes 04/17/2022: The wound is a bit narrower today but the length is roughly the same. There is still a fair amount of slough accumulation on the surface but it is less painful. Edema control is good. Electronic Signature(s) Signed: 04/17/2022 10:17:49 AM By: Duanne Guess MD FACS Signed: 04/17/2022 10:17:49 AM By: Duanne Guess MD FACS Entered By: Duanne Guess on 04/17/2022 10:17:49 -------------------------------------------------------------------------------- Physician Orders Details Patient Name: Date of Service: Ashley Stone, Ashley Stone 04/17/2022 9:30 A M Medical Record Number: [id number] Patient Account Number: [id number] Date of Birth/Sex: Treating RN: September 19, 1945 (76 y.o. [name] Primary Care Provider: Majel Homer Other Clinician: Referring Provider: Treating Provider/Extender: Jonah Blue in Treatment: 1 Verbal / Phone Orders: No Diagnosis Coding ICD-10 Coding Code Description [phone] Non-pressure chronic ulcer of other part of left lower leg with fat layer exposed E11.622 Type 2 diabetes mellitus with other skin ulcer I73.9 Peripheral vascular disease, unspecified E11.65 Type 2 diabetes mellitus with  hyperglycemia Follow-up Appointments ppointment in 1 week. - Dr Lady Gary Room 4 Wednesday August 23rd at 11am Return A Anesthetic (In clinic) Topical Lidocaine 5% applied to wound bed - Used in clinic Bathing/ Shower/ Hygiene May shower with protection but do not get wound dressing(s) wet. - Please do not get left leg wet use a cast protector on left leg if showering or taking a bath Cast Protectors cost around $20-$30 from [employer] etc, Edema Control - Lymphedema / SCD / Other Avoid standing for long periods of time. Moisturize legs daily. Compression stocking or Garment 20-30 mm/Hg pressure to: - both legs -if compression wraps are on left leg then do not use the left compression stocking on the left leg Additional Orders / Instructions Other: - When you are able, you can use the leg measurements to purchase compression stockings from [employer]( see pamphlet) Wound Treatment Wound #1 - Lower Leg Wound Laterality: Left, Posterior Cleanser: Soap and Water 1 x Per Week/30 Days Discharge Instructions: May shower and wash wound with dial antibacterial soap and water prior to dressing change. Cleanser: Wound Cleanser 1 x Per Week/30 Days Discharge Instructions: Cleanse the wound with wound cleanser prior to applying a clean dressing using gauze sponges, not tissue or cotton balls. Prim Dressing: Santyl Ointment 1 x Per Week/30 Days ary Discharge Instructions: Apply nickel thick amount to wound bed as instructed Secondary Dressing: ABD Pad, 8x10 1 x Per Week/30 Days Discharge Instructions: Apply over primary dressing as directed. Secondary Dressing: Woven Gauze Sponge, Non-Sterile 4x4 in 1 x Per Week/30 Days Discharge Instructions: Apply over primary dressing as directed. Compression Wrap: ThreePress (3 layer compression wrap) 1 x Per Week/30 Days Discharge Instructions: Apply three layer compression as directed. Compression Wrap: Tubular Net 1 x Per Week/30  Days Electronic Signature(s) Signed: 04/17/2022 12:36:36 PM By: Duanne Guess MD FACS Entered By: Duanne Guess on 04/17/2022 10:18:04 -------------------------------------------------------------------------------- Problem List Details Patient Name: Date of Service: Ashley Stone, Ashley Stone 04/17/2022 9:30 A M Medical Record Number: [id number] Patient Account Number: [id number] Date of Birth/Sex: Treating RN: 1945-12-17 (76 y.o. [name] Primary Care Provider: Majel Homer Other Clinician: Referring Provider: Treating Provider/Extender: Jonah Blue in Treatment: 1 Active Problems ICD-10 Encounter Code Description Active Date MDM Diagnosis [phone] Non-pressure chronic ulcer of other part of left lower leg with fat layer exposed8/07/2022 No Yes E11.622 Type 2 diabetes mellitus with other skin ulcer 04/10/2022 No Yes  I73.9 Peripheral vascular disease, unspecified 04/10/2022 No Yes E11.65 Type 2 diabetes mellitus with hyperglycemia 04/10/2022 No Yes Inactive Problems Resolved Problems Electronic Signature(s) Signed: 04/17/2022 10:14:55 AM By: Duanne Guessannon, Devine Klingel MD FACS Entered By: Duanne Guessannon, Aiyla Baucom on 04/17/2022 10:14:55 -------------------------------------------------------------------------------- Progress Note Details Patient Name: Date of Service: Ashley Stone, Ashley Stone 04/17/2022 9:30 A M Medical Record Number: [id number] Patient Account Number: [id number] Date of Birth/Sex: Treating RN: [birth year] (76 y.o. Katrinka BlazingF) Scotton, Joanne Primary Care Provider: Majel HomerBuchanan, Linda Other Clinician: Referring Provider: Treating Provider/Extender: Jonah Blueannon, Ayumi Wangerin Buchanan, Linda Weeks in Treatment: 1 Subjective Chief Complaint Information obtained from Patient Patient seen for complaints of Non-Healing Wound. History of Present Illness (HPI) ADMISSION 04/10/2022 This is a 76 year old poorly controlled type II diabetic (last hemoglobin A1c 8.1% in July). She  suffered a fall from her bed on July 3 which resulted in laceration of her posteromedial left leg. She was seen in the emergency department local to her where Steri-Strips were used to approximate the wound and she was given a course of Keflex. The wound has failed to heal since that time and her primary care provider referred her to the wound care center for further evaluation and management. ABI in clinic was 1.34. On her left posteromedial calf, there is a laceration with heavy eschar and slough accumulation. The periwound skin is intact. She has 1+ pitting edema. No erythema, induration, or purulent drainage/malodor to suggest infection. 04/17/2022: The wound is a bit narrower today but the length is roughly the same. There is still a fair amount of slough accumulation on the surface but it is less painful. Edema control is good. Patient History Information obtained from Patient. Family History Unknown History. Social History Never smoker, Marital Status - Single, Alcohol Use - Never, Drug Use - No History, Caffeine Use - Moderate - tea. Medical History Hematologic/Lymphatic Patient has history of Anemia Cardiovascular Patient has history of Arrhythmia - Hx SVT Congestive Heart Failure, Hypertension , Endocrine Patient has history of Type II Diabetes Genitourinary Patient has history of End Stage Renal Disease - Stage 3 a chronic kidney disease Hospitalization/Surgery History - Hysterectomy. Medical A Surgical History Notes nd Eyes Left eye vision problems (bee sting under eye from 04/03/22) Ear/Nose/Mouth/Throat Tinnitus Endocrine Hypothyroidism Objective Constitutional Hypertensive, asymptomatic. No acute distress.. Vitals Time Taken: 9:53 AM, Height: 70 in, Temperature: 99.1 F, Pulse: 93 bpm, Respiratory Rate: 18 breaths/min, Blood Pressure: 182/82 mmHg. Respiratory Normal work of breathing on room air.. General Notes: 04/17/2022: The wound is a bit narrower today but the  length is roughly the same. There is still a fair amount of slough accumulation on the surface but it is less painful. Edema control is good. Integumentary (Hair, Skin) Wound #1 status is Open. Original cause of wound was Laceration. The date acquired was: 03/02/2022. The wound has been in treatment 1 weeks. The wound is located on the Left,Posterior Lower Leg. The wound measures 3cm length x 3.5cm width x 0.2cm depth; 8.247cm^2 area and 1.649cm^3 volume. There is Fat Layer (Subcutaneous Tissue) exposed. There is no tunneling or undermining noted. There is a medium amount of serosanguineous drainage noted. There is medium (34-66%) red, pink granulation within the wound bed. There is a medium (34-66%) amount of necrotic tissue within the wound bed including Eschar and Adherent Slough. Assessment Active Problems ICD-10 Non-pressure chronic ulcer of other part of left lower leg with fat layer exposed Type 2 diabetes mellitus with other skin ulcer Peripheral vascular disease, unspecified Type 2 diabetes mellitus with hyperglycemia Procedures Wound #1  Pre-procedure diagnosis of Wound #1 is a Trauma, Other located on the Left,Posterior Lower Leg . There was a Excisional Skin/Subcutaneous Tissue Debridement with a total area of 10.5 sq cm performed by Duanne Guess, MD. With the following instrument(s): Curette to remove Non-Viable tissue/material. Material removed includes Subcutaneous Tissue and Slough and after achieving pain control using Lidocaine 4% T opical Solution. No specimens were taken. A time out was conducted at 10:12, prior to the start of the procedure. A Minimum amount of bleeding was controlled with Pressure. The procedure was tolerated well with a pain level of 0 throughout and a pain level of 0 following the procedure. Post Debridement Measurements: 3cm length x 3.5cm width x 0.2cm depth; 1.649cm^3 volume. Character of Wound/Ulcer Post Debridement is improved. Post procedure  Diagnosis Wound #1: Same as Pre-Procedure Pre-procedure diagnosis of Wound #1 is a Trauma, Other located on the Left,Posterior Lower Leg . There was a Three Layer Compression Therapy Procedure by Karie Schwalbe, RN. Post procedure Diagnosis Wound #1: Same as Pre-Procedure Plan Follow-up Appointments: Return Appointment in 1 week. - Dr Lady Gary Room 4 Wednesday August 23rd at 11am Anesthetic: (In clinic) Topical Lidocaine 5% applied to wound bed - Used in clinic Bathing/ Shower/ Hygiene: May shower with protection but do not get wound dressing(s) wet. - Please do not get left leg wet use a cast protector on left leg if showering or taking a bath Cast Protectors cost around $20-$30 from [employer] etc, Edema Control - Lymphedema / SCD / Other: Avoid standing for long periods of time. Moisturize legs daily. Compression stocking or Garment 20-30 mm/Hg pressure to: - both legs -if compression wraps are on left leg then do not use the left compression stocking on the left leg Additional Orders / Instructions: Other: - When you are able, you can use the leg measurements to purchase compression stockings from [employer]( see pamphlet) WOUND #1: - Lower Leg Wound Laterality: Left, Posterior Cleanser: Soap and Water 1 x Per Week/30 Days Discharge Instructions: May shower and wash wound with dial antibacterial soap and water prior to dressing change. Cleanser: Wound Cleanser 1 x Per Week/30 Days Discharge Instructions: Cleanse the wound with wound cleanser prior to applying a clean dressing using gauze sponges, not tissue or cotton balls. Prim Dressing: Santyl Ointment 1 x Per Week/30 Days ary Discharge Instructions: Apply nickel thick amount to wound bed as instructed Secondary Dressing: ABD Pad, 8x10 1 x Per Week/30 Days Discharge Instructions: Apply over primary dressing as directed. Secondary Dressing: Woven Gauze Sponge, Non-Sterile 4x4 in 1 x Per Week/30 Days Discharge  Instructions: Apply over primary dressing as directed. Com pression Wrap: ThreePress (3 layer compression wrap) 1 x Per Week/30 Days Discharge Instructions: Apply three layer compression as directed. Com pression Wrap: Tubular Net 1 x Per Week/30 Days 04/17/2022: The wound is a bit narrower today but the length is roughly the same. There is still a fair amount of slough accumulation on the surface but it is less painful. Edema control is good. I used a curette to debride the slough from the wound surface along with nonviable subcutaneous tissue. We will continue to use Santyl for further enzymatic debridement and 3 layer compression. Follow-up in 1 week. Electronic Signature(s) Signed: 04/17/2022 10:18:38 AM By: Duanne Guess MD FACS Entered By: Duanne Guess on 04/17/2022 10:18:38 -------------------------------------------------------------------------------- HxROS Details Patient Name: Date of Service: Ashley Stone, Ashley Stone 04/17/2022 9:30 A M Medical Record Number: [id number] Patient Account Number: [id number] Date of Birth/Sex: Treating RN: 1946/04/05 (76  y.o. [name] Primary Care Provider: Majel Homer Other Clinician: Referring Provider: Treating Provider/Extender: Jonah Blue in Treatment: 1 Information Obtained From Patient Eyes Medical History: Past Medical History Notes: Left eye vision problems (bee sting under eye from 04/03/22) Ear/Nose/Mouth/Throat Medical History: Past Medical History Notes: Tinnitus Hematologic/Lymphatic Medical History: Positive for: Anemia Cardiovascular Medical History: Positive for: Arrhythmia - Hx SVT Congestive Heart Failure; Hypertension ; Endocrine Medical History: Positive for: Type II Diabetes Past Medical History Notes: Hypothyroidism Time with diabetes: 2000 Treated with: Insulin Blood sugar testing results: Breakfast: 109 Genitourinary Medical History: Positive for: End Stage Renal  Disease - Stage 3 a chronic kidney disease Immunizations Pneumococcal Vaccine: Received Pneumococcal Vaccination: Yes Received Pneumococcal Vaccination On or After 60th Birthday: Yes Tetanus Vaccine: Last tetanus shot: 03/02/2022 Implantable Devices No devices added Hospitalization / Surgery History Type of Hospitalization/Surgery Hysterectomy Family and Social History Unknown History: Yes; Never smoker; Marital Status - Single; Alcohol Use: Never; Drug Use: No History; Caffeine Use: Moderate - tea; Financial Concerns: No; Food, Clothing or Shelter Needs: No; Support System Lacking: No; Transportation Concerns: No Electronic Signature(s) Signed: 04/17/2022 12:36:36 PM By: Duanne Guess MD FACS Signed: 04/17/2022 5:23:24 PM By: Karie Schwalbe RN Entered By: Duanne Guess on 04/17/2022 10:17:18 -------------------------------------------------------------------------------- SuperBill Details Patient Name: Date of Service: Ashley Stone, Ashley Stone 04/17/2022 Medical Record Number: [id number] Patient Account Number: [id number] Date of Birth/Sex: Treating RN: 01/17/1946 (76 y.o. [name] Primary Care Provider: Majel Homer Other Clinician: Referring Provider: Treating Provider/Extender: Jonah Blue in Treatment: 1 Diagnosis Coding ICD-10 Codes Code Description [phone] Non-pressure chronic ulcer of other part of left lower leg with fat layer exposed E11.622 Type 2 diabetes mellitus with other skin ulcer I73.9 Peripheral vascular disease, unspecified E11.65 Type 2 diabetes mellitus with hyperglycemia Facility Procedures CPT4 Code: [id number] Description: 11042 - DEB SUBQ TISSUE 20 SQ CM/< ICD-10 Diagnosis Description L97.822 Non-pressure chronic ulcer of other part of left lower leg with fat layer expo Modifier: sed Quantity: 1 Physician Procedures : CPT4 Code Description Modifier [id number] - WC PHYS LEVEL 4 - EST PT 25 ICD-10 Diagnosis  Description L97.822 Non-pressure chronic ulcer of other part of left lower leg with fat layer exposed E11.622 Type 2 diabetes mellitus with other skin ulcer  I73.9 Peripheral vascular disease, unspecified Quantity: 1 : [id number] 11042 - WC PHYS SUBQ TISS 20 SQ CM ICD-10 Diagnosis Description L97.822 Non-pressure chronic ulcer of other part of left lower leg with fat layer exposed Quantity: 1 Electronic Signature(s) Signed: 04/17/2022 10:19:03 AM By: Duanne Guess MD FACS Entered By: Duanne Guess on 04/17/2022 10:19:03

## 2022-04-22 ENCOUNTER — Encounter (HOSPITAL_BASED_OUTPATIENT_CLINIC_OR_DEPARTMENT_OTHER): Payer: Medicaid Other | Admitting: General Surgery
